# Patient Record
Sex: Female | Born: 1975 | Race: Asian | Hispanic: No | Marital: Married | Smoking: Never smoker
Health system: Southern US, Community
[De-identification: ages and names within clinical notes are randomized; demographics above are authoritative.]

## PROBLEM LIST (undated history)

## (undated) DIAGNOSIS — N762 Acute vulvitis: Secondary | ICD-10-CM

## (undated) DIAGNOSIS — N920 Excessive and frequent menstruation with regular cycle: Secondary | ICD-10-CM

## (undated) DIAGNOSIS — D649 Anemia, unspecified: Secondary | ICD-10-CM

## (undated) DIAGNOSIS — D249 Benign neoplasm of unspecified breast: Secondary | ICD-10-CM

## (undated) DIAGNOSIS — D563 Thalassemia minor: Secondary | ICD-10-CM

## (undated) DIAGNOSIS — N854 Malposition of uterus: Secondary | ICD-10-CM

## (undated) DIAGNOSIS — J069 Acute upper respiratory infection, unspecified: Secondary | ICD-10-CM

## (undated) HISTORY — DX: Anemia, unspecified: D64.9

## (undated) HISTORY — DX: Malposition of uterus: N85.4

## (undated) HISTORY — PX: BREAST EXCISIONAL BIOPSY: SUR124

## (undated) HISTORY — DX: Benign neoplasm of unspecified breast: D24.9

## (undated) HISTORY — DX: Acute vulvitis: N76.2

## (undated) HISTORY — DX: Thalassemia minor: D56.3

## (undated) HISTORY — DX: Excessive and frequent menstruation with regular cycle: N92.0

## (undated) HISTORY — DX: Acute upper respiratory infection, unspecified: J06.9

---
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
## 2004-01-30 ENCOUNTER — Other Ambulatory Visit: Admission: RE | Admit: 2004-01-30 | Discharge: 2004-01-30 | Payer: Self-pay | Admitting: Obstetrics and Gynecology

## 2004-04-15 ENCOUNTER — Ambulatory Visit (HOSPITAL_COMMUNITY): Admission: RE | Admit: 2004-04-15 | Discharge: 2004-04-15 | Payer: Self-pay | Admitting: Obstetrics and Gynecology

## 2004-08-09 ENCOUNTER — Inpatient Hospital Stay (HOSPITAL_COMMUNITY): Admission: RE | Admit: 2004-08-09 | Discharge: 2004-08-12 | Payer: Self-pay | Admitting: Obstetrics and Gynecology

## 2005-01-20 ENCOUNTER — Other Ambulatory Visit: Admission: RE | Admit: 2005-01-20 | Discharge: 2005-01-20 | Payer: Self-pay | Admitting: Obstetrics and Gynecology

## 2005-03-04 ENCOUNTER — Ambulatory Visit (HOSPITAL_COMMUNITY): Admission: RE | Admit: 2005-03-04 | Discharge: 2005-03-04 | Payer: Self-pay | Admitting: Obstetrics and Gynecology

## 2006-09-04 ENCOUNTER — Emergency Department (HOSPITAL_COMMUNITY): Admission: EM | Admit: 2006-09-04 | Discharge: 2006-09-04 | Payer: Self-pay | Admitting: Emergency Medicine

## 2006-12-16 ENCOUNTER — Encounter: Admission: RE | Admit: 2006-12-16 | Discharge: 2006-12-16 | Payer: Self-pay | Admitting: Obstetrics and Gynecology

## 2007-09-08 ENCOUNTER — Emergency Department (HOSPITAL_COMMUNITY): Admission: EM | Admit: 2007-09-08 | Discharge: 2007-09-08 | Payer: Self-pay | Admitting: Emergency Medicine

## 2010-07-14 ENCOUNTER — Encounter: Payer: Self-pay | Admitting: Obstetrics and Gynecology

## 2010-11-08 NOTE — Op Note (Signed)
Debra Austin, Debra Austin                 ACCOUNT NO.:  [id number]   MEDICAL RECORD NO.:  [id number]          PATIENT TYPE:  INP   LOCATION:  9137                          FACILITY:  [facility]   PHYSICIAN:  Hal Morales, M.D.DATE OF BIRTH:  1975-06-25   DATE OF PROCEDURE:  08/09/2004  DATE OF DISCHARGE:                                 OPERATIVE REPORT   PREOPERATIVE DIAGNOSES:  Intrauterine pregnancy at term, prior cesarean  section, desire for repeat cesarean section, upper respiratory infection.   POSTOPERATIVE DIAGNOSES:  Intrauterine pregnancy at term, prior cesarean  section, desire for repeat cesarean section, upper respiratory infection.   OPERATION:  Repeat low transverse cesarean section.   SURGEON:  Hal Morales, M.D.   FIRST ASSISTANT:  Wynelle Bourgeois, CNM   ANESTHESIA:  Spinal.   ESTIMATED BLOOD LOSS:  750 cc.   COMPLICATIONS:  None.   FINDINGS:  The patient was delivered of a female infant weighing 6 pounds 4  ounces with Apgar's of  9 & 9 at 1 and 5 minutes respectively. The uterus,  tubes and ovaries were normal for the gravid state.   PROCEDURE:  The patient is taken to the operating room after appropriate  identification and placed on the operating table. After placement of a  spinal anesthetic, she was placed in the supine position with a left lateral  tilt. The abdomen and perineum were prepped with multiple layers of Betadine  and the Foley catheter inserted into the bladder and connected to straight  drainage. The abdomen was draped as a sterile field. After assurance of  adequate anesthesia, subcutaneous injection of 0.25% Marcaine for total of  20 cc was undertaken in the suprapubic region of the old incision. That  incision was then sharply excised and the abdomen opened in layers. The  peritoneum was entered and the bladder blade placed. The uterus was incised  approximately 2 cm above the uterovesical fold and that incision taken  laterally on either  side bluntly. The infant was delivered from the occiput  transverse position and after having the nares and pharynx suctioned and  cord clamped and cut was handed off to awaiting pediatricians. The  appropriate cord blood was drawn and placenta noted to have separated from  the uterus and was removed from the operative field. The uterine incision  was closed with a running interlocking suture of #0Vicryl. An imbricating  suture of #0 Vicryl was placed. Hemostasis was achieved with several figure-  of-eight sutures in the uterus and copious irrigation carried out. The  abdominal peritoneum closed with a running suture of 2-0 Vicryl. The rectus  muscles were reapproximated in midline with figure-of-eight suture of 2-0  Vicryl. The rectus muscles were made hemostatic with Bovie cautery and  rectus fascia closed with running suture of #0 Vicryl then reinforced on  either side of midline with figure-of-eight sutures of #0 Vicryl. The  subcutaneous tissue was made hemostatic with Bovie cautery and irrigated. A  subcutaneous 7 mm Jackson-Pratt drain was placed through a stab wound in the  right lower quadrant. It was sewn in  with a suture of 2-0 silk. The incision  was then closed with a subcuticular suture of 3-0  Monocryl. A sterile dressing was applied and the patient taken from the  operating room to the recovery room in satisfactory condition having  tolerated the procedure well with sponge and instrument counts correct. The  infant went to the full-term nursery.      VPH/MEDQ  D:  08/09/2004  T:  08/10/2004  Job:  [id number]

## 2010-11-08 NOTE — Discharge Summary (Signed)
Debra Austin, Debra Austin                 ACCOUNT NO.:  [id number]   MEDICAL RECORD NO.:  [id number]          PATIENT TYPE:  INP   LOCATION:  9137                          [facility]   PHYSICIAN:  Hal Morales, M.D.DATE OF BIRTH:  Apr 23, 1976   DATE OF ADMISSION:  08/09/2004  DATE OF DISCHARGE:  08/12/2004                                 DISCHARGE SUMMARY   ADMISSION DIAGNOSES:  1.  Intrauterine pregnancy at 39 weeks.  2.  Previous cesarean section.  3.  Desires elective repeat cesarean section.   DISCHARGE DIAGNOSES:  1.  Intrauterine pregnancy at 39 weeks.  2.  Previous cesarean section.  3.  Desires elective repeat cesarean section.  4.  Status post cesarean delivery of a viable female infant weighing 6 pounds      4 ounces.  Apgars 9 and 9.   HOSPITAL PROCEDURES:  1.  Spinal anesthesia.  2.  Repeat low transverse cesarean section.   HOSPITAL COURSE:  The patient was admitted for an elective repeat cesarean  section, which was performed by Dr. Pennie Rushing without complications.  She was  taken to the mother baby unit, where she has done well for the last three  days.  On postoperative day #1 she was ambulating well, voiding, eating, and  drinking without difficulty.  She was getting good relief from [facility] with her cough and her upper respiratory infection improved over the  three days.  She was on amoxicillin previously for an upper respiratory  infection and continued on that.  Her hemoglobin was 9.2, which was stable.  On postoperative day #2 she continued to improve and on postoperative day #3  she was ready to go home.  Her cough was greatly improved.  Her vital signs  were stable and she was afebrile.  Her chest was clear.  Heart was a regular  rate and rhythm.  Abdomen was soft and appropriately tender.  The incision  was clean, dry, and intact.  The JP was draining a small amount.  Lochia was  small.  Extremities were within normal limits.  She was deemed to  have  receive the full benefit of her hospital stay and was discharged home after  removal of the JP drain.   DISCHARGE MEDICATIONS:  1.  Motrin 600 mg p.o. q.6h. p.r.n.  2.  Tylox 1-2 p.o. q.4h. p.r.n.  3.  Tessalon 100 mg p.o. q.4h. p.r.n.   DISCHARGE LABORATORIES:  White blood cell count 14.9, hemoglobin 9.2,  platelet count 282.  RPR nonreactive.   DISCHARGE INSTRUCTIONS:  Per [facility] handout.   DISCHARGE FOLLOWUP:  In six weeks or p.r.n.      MLW/MEDQ  D:  08/12/2004  T:  08/12/2004  Job:  [id number]

## 2010-11-08 NOTE — H&P (Signed)
Debra Austin, Debra Austin                 ACCOUNT NO.:  [id number]   MEDICAL RECORD NO.:  [id number]          PATIENT TYPE:  INP   LOCATION:  NA                            [facility]   PHYSICIAN:  Hal Morales, M.D.DATE OF BIRTH:  [birth year]   DATE OF ADMISSION:  DATE OF DISCHARGE:                                HISTORY & PHYSICAL   This is a 35 year old, gravida 2, para 1-0-0-1, at 38 weeks who presents for  an elective repeat cesarean section.  She had a C-section for her first baby  for footling breech presentation and has elected to have an elective repeat  cesarean section.  She developed an upper respiratory infection without  fever one week prior to scheduled cesarean.   Pregnancy has been followed by Dr. Pennie Rushing and remarkable for:  1.  Unsure LMP.  2.  Previous C-section.  3.  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  4.  Father of the baby also a thalassemia carrier.  5.  Removal of left breast fibroadenoma.  6.  Desires repeat C-section.   ALLERGIES:  None.   MEDICATIONS:  Prenatal vitamins, Hemocyte.   OBSTETRICAL HISTORY:  Remarkable for primary low-transverse cesarean  section, in 2001, with a female infant at 37 weeks' gestation, weighing 6  pounds 13 ounces, remarkable for a breech presentation with no  complications.   MEDICAL HISTORY:  1.  Childhood Varicella.  2.  Occasional yeast infections.  3.  Thalassemia carrier.  4.  Anemia   SURGICAL HISTORY:  1.  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  2.  C-section in 2001.   FAMILY HISTORY:  Remarkable for grandmother and grandfather with heart  disease.  Mother with hypertension.   GENETIC HISTORY:  Remarkable for the patient and her husband both with  thalassemia trait.  Genetic counseling and amniocentesis were declined.   HISTORY OF CURRENT PREGNANCY:  The patient entered care at 8 weeks'  gestation.  She elected care with Dr. Pennie Rushing.  Her ultrasound at 18 weeks  showed normal anatomy but  that anatomy scan was incomplete so she had  another one at 22 weeks which completed the anatomy scan.  She did have some  low iron at 22 weeks, and she was placed on iron supplementation.  Glucola  was within normal limits at 26 weeks.  She had some gastric reflux, treated  with Zantac.  She had poor weight gain through the pregnancy and had an  ultrasound at 34 weeks showing 50th percentile growth.  Group B strep and  cultures were negative at term and she presents for an elective repeat C-  section.   PRENATAL LABS:  Hemoglobin 10.7, platelets 261, blood type O positive,  antibody screen negative.  RPR nonreactive.  Rubella immune.  Hepatitis  negative.  HIV declined.  Glucola normal.  Group B strep negative.   OBJECTIVE DATA:  VITAL SIGNS:  Stable, afebrile.  HEENT:  Within normal limits.  Thyroid normal, not enlarged.  CHEST:  Clear to auscultation.  HEART:  Regular rate and rhythm.  ABDOMEN:  Gravid at 37-cm, vertex  to Leopold's.  Fetal heart rate 145.  EXTREMITIES:  Within normal limits.   ASSESSMENT:  1.  Intrauterine pregnancy at 39 weeks.  2.  Previous cesarean section.  3.  Desires elective repeat cesarean section.   PLAN:  1.  Admit to the operating suite.  2.  Routine OR orders.  3.  Further orders to follow after surgery.      MLW/MEDQ  D:  08/07/2004  T:  08/07/2004  Job:  [id number]

## 2011-03-17 LAB — POCT RAPID STREP A: Streptococcus, Group A Screen (Direct): NEGATIVE

## 2011-09-14 ENCOUNTER — Emergency Department (HOSPITAL_COMMUNITY): Admission: EM | Admit: 2011-09-14 | Discharge: 2011-09-14 | Discharge status: Against Medical Advice | Payer: Self-pay

## 2011-10-13 ENCOUNTER — Encounter: Payer: Self-pay | Admitting: Obstetrics and Gynecology

## 2011-12-01 ENCOUNTER — Telehealth: Payer: Self-pay | Admitting: Obstetrics and Gynecology

## 2011-12-01 NOTE — Telephone Encounter (Signed)
Debra Austin/cht received 

## 2011-12-02 NOTE — Telephone Encounter (Signed)
Tc to pt per telephone call. Pt c/o BTB this cycle. Pt started Loestrin 24 in January and has had good regulation of cycles. Pt admits to increase stress this month. Pt denies missing any pills. Informed pt to continue bcp's as directed and to call back if bldg increases(changing >1pad hourly), abd pain, or fever occurs. Fu appt sched 12/29/11 with vph. Pt agrees.

## 2011-12-29 ENCOUNTER — Encounter: Payer: Self-pay | Admitting: Obstetrics and Gynecology

## 2011-12-29 ENCOUNTER — Ambulatory Visit (INDEPENDENT_AMBULATORY_CARE_PROVIDER_SITE_OTHER): Payer: 59 | Admitting: Obstetrics and Gynecology

## 2011-12-29 VITALS — BP 112/64 | Ht 67.5 in | Wt 159.0 lb

## 2011-12-29 DIAGNOSIS — N939 Abnormal uterine and vaginal bleeding, unspecified: Secondary | ICD-10-CM

## 2011-12-29 DIAGNOSIS — D563 Thalassemia minor: Secondary | ICD-10-CM | POA: Insufficient documentation

## 2011-12-29 DIAGNOSIS — N926 Irregular menstruation, unspecified: Secondary | ICD-10-CM

## 2011-12-29 NOTE — Progress Notes (Signed)
Pt here to f/u on BCP.  States that in June she had some in between bleeding that has never happened before. State that she was in the process of moving which she believes to be the reason for that.No further irregular bleeding.  States that the BCP has helped with regulating cycle.   BP 112/64  Ht 5' 7.5" (1.715 m)  Wt 159 lb (72.122 kg)  BMI 24.54 kg/m2  LMP 12/15/2011  Pelvic exam: normal external genitalia, vulva, vagina, cervix, uterus and adnexa .  Impression: Improved menorrhagia on BCP's Thallasseia trait with anemia  Recommendation: Continue LoEstrin F/U 12/13 for aex

## 2012-03-01 ENCOUNTER — Other Ambulatory Visit: Payer: Self-pay | Admitting: Obstetrics and Gynecology

## 2012-03-01 ENCOUNTER — Telehealth: Payer: Self-pay | Admitting: Obstetrics and Gynecology

## 2012-03-01 ENCOUNTER — Telehealth: Payer: Self-pay

## 2012-03-01 MED ORDER — NORETHIN ACE-ETH ESTRAD-FE 1-20 MG-MCG PO TABS: 1.0000 | ORAL_TABLET | Freq: Every day | ORAL | Status: DC

## 2012-03-01 MED ORDER — NORETHINDRONE ACET-ETHINYL EST 1-20 MG-MCG PO TABS: 1.0000 | ORAL_TABLET | Freq: Every day | ORAL | Status: AC

## 2012-03-01 MED ORDER — NORETHIN ACE-ETH ESTRAD-FE 1-20 MG-MCG(24) PO CHEW: 1.0000 | CHEWABLE_TABLET | Freq: Every day | ORAL | Status: DC

## 2012-03-01 NOTE — Telephone Encounter (Signed)
Tc to pt per ep recs. Pt agrees. Pt will try new bc and cb if needs to switch back to prev rx based on cycles. Rx on file for Junel 1/20 e-pres to pharm on file.

## 2012-03-01 NOTE — Telephone Encounter (Signed)
Tc from pt. Pt needs rf for Loestrin 24;however med has been discontinued. Rx to be changed to Minastrin 24(sub for Loestrin 24;however pt's copay cost is too expensive for Minastrin. Pt opts to try another bcp similar to Loestrin 24(possibly Loestrin 1/20 per pharm). Pt due to start new pill pack on 03/07/12. Will consult with provider and cb with recs. Pt agrees.

## 2012-03-01 NOTE — Telephone Encounter (Signed)
Patient previously on Loestrin 24 and was switched to Minastin 24 due to discontinuation of the former is requesting pill change due to cost.  Patient may begin with next cycle Junel 1/20  # 1  1 po qd with 11 refills.  Ellowyn Rieves, PA-c

## 2012-03-01 NOTE — Addendum Note (Signed)
Addended by: Lerry Liner D on: 03/01/2012 01:46 PM   Modules accepted: Orders

## 2012-03-29 ENCOUNTER — Telehealth: Payer: Self-pay

## 2012-03-29 NOTE — Telephone Encounter (Signed)
Tc from pt. Pt would like Loestrin 1/20 with added iron. Rx called into pharm on file. Spoke with Tera(pharm). Pt aware.

## 2012-03-30 ENCOUNTER — Telehealth: Payer: Self-pay

## 2012-03-30 MED ORDER — NORETHIN ACE-ETH ESTRAD-FE 1-20 MG-MCG PO TABS: 1.0000 | ORAL_TABLET | Freq: Every day | ORAL | Status: DC

## 2012-03-30 NOTE — Telephone Encounter (Signed)
Send new rx to pt pharmacy. Pt wanted Loestrin 1/20 fe instead of Loestrin 1/20

## 2012-03-31 ENCOUNTER — Other Ambulatory Visit: Payer: Self-pay

## 2012-03-31 MED ORDER — NORETHIN ACE-ETH ESTRAD-FE 1-20 MG-MCG PO TABS: 1.0000 | ORAL_TABLET | Freq: Every day | ORAL | Status: DC

## 2012-03-31 NOTE — Telephone Encounter (Signed)
loestrin refilled per protocol  ld

## 2012-05-31 ENCOUNTER — Encounter: Payer: Self-pay | Admitting: Obstetrics and Gynecology

## 2012-05-31 ENCOUNTER — Ambulatory Visit (INDEPENDENT_AMBULATORY_CARE_PROVIDER_SITE_OTHER): Payer: 59 | Admitting: Obstetrics and Gynecology

## 2012-05-31 VITALS — BP 104/62 | HR 70 | Resp 16 | Ht 67.0 in | Wt 152.0 lb

## 2012-05-31 DIAGNOSIS — D563 Thalassemia minor: Secondary | ICD-10-CM

## 2012-05-31 DIAGNOSIS — N939 Abnormal uterine and vaginal bleeding, unspecified: Secondary | ICD-10-CM

## 2012-05-31 DIAGNOSIS — Z124 Encounter for screening for malignant neoplasm of cervix: Secondary | ICD-10-CM

## 2012-05-31 MED ORDER — NORETHIN ACE-ETH ESTRAD-FE 1-20 MG-MCG PO TABS: 1.0000 | ORAL_TABLET | Freq: Every day | ORAL | Status: AC

## 2012-05-31 NOTE — Progress Notes (Signed)
AEX  Last Pap: 06/02/2011 WNL: Yes Regular Periods:yes Contraception: Birth control pill  Monthly Breast exam:yes Tetanus<67yrs:yes Nl.Bladder Function:yes Daily BMs:yes Healthy Diet:yes Calcium:yes Mammogram:yes Date of Mammogram: 5 yrs ago Exercise:no Have often Exercise: NA Seatbelt: yes Abuse at home: no Stressful work:no Sigmoid-colonoscopy: None Bone Density: No PCP: Georgann Housekeeper Change in PMH: None Change in [facility]: None  Subjective:    Debra Austin is a 36 y.o. female, G2P2002, who presents for an annual exam without complaint    History   Social History  . Marital Status: Married    Spouse Name: N/A    Number of Children: N/A  . Years of Education: N/A   Social History Main Topics  . Smoking status: Never Smoker   . Smokeless tobacco: Never Used  . Alcohol Use: No  . Drug Use: No  . Sexually Active: Yes    Birth Control/ Protection: Pill   Other Topics Concern  . None   Social History Narrative  . None    Menstrual cycle:   LMP: Patient's last menstrual period was 05/22/2012.           Cycle: Regular without IM bleeding on bcps The following portions of the patient's history were reviewed and updated as appropriate: allergies, current medications, past family history, past medical history, past social history, past surgical history and problem list.  Review of Systems Pertinent items are noted in HPI. Breast:Negative for breast lump,nipple discharge or nipple retraction Gastrointestinal: Negative for abdominal pain, change in bowel habits or rectal bleeding Urinary:negative   Objective:    BP 104/62  Pulse 70  Resp 16  Ht 5\' 7"  (1.702 m)  Wt 152 lb (68.947 kg)  BMI 23.81 kg/m2  LMP 05/22/2012    Weight:  Wt Readings from Last 1 Encounters:  05/31/12 152 lb (68.947 kg)          BMI: Body mass index is 23.81 kg/(m^2).  General Appearance: Alert, appropriate appearance for age. No acute distress HEENT: Grossly normal Neck / Thyroid:  Supple, no masses, nodes or enlargement Lungs: clear to auscultation bilaterally Back: No CVA tenderness Breast Exam: right breast mass at 3 o'clock  3mm mobile and nontender.  Left breast with mass at 4 o'clock mobile and nontender 5mm Cardiovascular: Regular rate and rhythm. S1, S2, no murmur Gastrointestinal: Soft, non-tender, no masses or organomegaly Pelvic Exam: Vulva and vagina appear normal. Bimanual exam reveals normal uterus and adnexa. Rectovaginal: normal rectal, no masses Lymphatic Exam: Non-palpable nodes in neck, clavicular, axillary, or inguinal regions Skin: no rash or abnormalities Neurologic: Normal gait and speech, no tremor  Psychiatric: Alert and oriented, appropriate affect.   Wet Prep:not applicable Urinalysis:not applicable UPT: Not done   Assessment:    History of fibroadenoma removal in 1995, now with bilateral masses most consistent on exam with fibroadenomas  Abnormal uterine bleeding, well managed with BCPs  Plan:    pap smear with HR HPV return annually or prn BILATERAL breast u/s STD screening: declined Contraception:oral contraceptives (estrogen/progesterone)   Dierdre Forth MD

## 2012-06-01 ENCOUNTER — Other Ambulatory Visit: Payer: Self-pay

## 2012-06-01 DIAGNOSIS — D249 Benign neoplasm of unspecified breast: Secondary | ICD-10-CM

## 2012-06-01 DIAGNOSIS — N6029 Fibroadenosis of unspecified breast: Secondary | ICD-10-CM

## 2012-06-01 LAB — PAP IG AND HPV HIGH-RISK: HPV DNA High Risk: NOT DETECTED

## 2012-06-09 ENCOUNTER — Encounter: Payer: Self-pay | Admitting: Obstetrics and Gynecology

## 2012-06-09 ENCOUNTER — Ambulatory Visit
Admission: RE | Admit: 2012-06-09 | Discharge: 2012-06-09 | Disposition: A | Discharge status: Home / Self Care | Payer: 59 | Source: Ambulatory Visit | Attending: Obstetrics and Gynecology | Admitting: Obstetrics and Gynecology

## 2012-06-09 ENCOUNTER — Other Ambulatory Visit: Payer: Self-pay | Admitting: Obstetrics and Gynecology

## 2012-06-09 DIAGNOSIS — N6029 Fibroadenosis of unspecified breast: Secondary | ICD-10-CM

## 2012-06-09 DIAGNOSIS — R922 Inconclusive mammogram: Secondary | ICD-10-CM | POA: Insufficient documentation

## 2012-06-09 DIAGNOSIS — D249 Benign neoplasm of unspecified breast: Secondary | ICD-10-CM

## 2012-06-09 DIAGNOSIS — N63 Unspecified lump in unspecified breast: Secondary | ICD-10-CM

## 2012-06-10 ENCOUNTER — Encounter: Payer: Self-pay | Admitting: Obstetrics and Gynecology

## 2014-04-24 ENCOUNTER — Encounter: Payer: Self-pay | Admitting: Obstetrics and Gynecology

## 2014-05-01 DIAGNOSIS — N926 Irregular menstruation, unspecified: Secondary | ICD-10-CM | POA: Insufficient documentation

## 2014-05-05 ENCOUNTER — Other Ambulatory Visit: Payer: Self-pay | Admitting: Obstetrics and Gynecology

## 2014-05-05 DIAGNOSIS — N649 Disorder of breast, unspecified: Secondary | ICD-10-CM

## 2014-05-22 ENCOUNTER — Ambulatory Visit
Admission: RE | Admit: 2014-05-22 | Discharge: 2014-05-22 | Disposition: A | Discharge status: Home / Self Care | Payer: 59 | Source: Ambulatory Visit | Attending: Obstetrics and Gynecology | Admitting: Obstetrics and Gynecology

## 2014-05-22 ENCOUNTER — Other Ambulatory Visit: Payer: Self-pay | Admitting: Obstetrics and Gynecology

## 2014-05-22 ENCOUNTER — Encounter (INDEPENDENT_AMBULATORY_CARE_PROVIDER_SITE_OTHER): Payer: Self-pay

## 2014-05-22 DIAGNOSIS — N649 Disorder of breast, unspecified: Secondary | ICD-10-CM

## 2015-05-15 ENCOUNTER — Other Ambulatory Visit: Payer: Self-pay | Admitting: Obstetrics and Gynecology

## 2015-05-15 DIAGNOSIS — N63 Unspecified lump in unspecified breast: Secondary | ICD-10-CM

## 2015-07-03 MED FILL — DASETTA 1-35-28 TABLET: 1-35 | 84 days supply | Qty: 84 | Fill #1

## 2015-07-11 DIAGNOSIS — D638 Anemia in other chronic diseases classified elsewhere: Secondary | ICD-10-CM | POA: Diagnosis not present

## 2015-07-11 DIAGNOSIS — Z0000 Encounter for general adult medical examination without abnormal findings: Secondary | ICD-10-CM | POA: Diagnosis not present

## 2015-07-11 DIAGNOSIS — R7309 Other abnormal glucose: Secondary | ICD-10-CM | POA: Diagnosis not present

## 2015-07-11 DIAGNOSIS — E559 Vitamin D deficiency, unspecified: Secondary | ICD-10-CM | POA: Diagnosis not present

## 2015-07-11 DIAGNOSIS — Z1389 Encounter for screening for other disorder: Secondary | ICD-10-CM | POA: Diagnosis not present

## 2015-07-11 DIAGNOSIS — K219 Gastro-esophageal reflux disease without esophagitis: Secondary | ICD-10-CM | POA: Diagnosis not present

## 2015-07-11 DIAGNOSIS — D563 Thalassemia minor: Secondary | ICD-10-CM | POA: Diagnosis not present

## 2015-07-11 MED FILL — PANTOPRAZOLE SOD DR 40 MG T: 40 | 30 days supply | Qty: 30 | Fill #0

## 2015-08-27 ENCOUNTER — Other Ambulatory Visit: Payer: Self-pay

## 2015-08-27 DIAGNOSIS — Z1231 Encounter for screening mammogram for malignant neoplasm of breast: Secondary | ICD-10-CM

## 2015-09-17 ENCOUNTER — Ambulatory Visit
Admission: RE | Admit: 2015-09-17 | Discharge: 2015-09-17 | Disposition: A | Discharge status: Home / Self Care | Payer: 59 | Source: Ambulatory Visit

## 2015-09-17 DIAGNOSIS — Z1231 Encounter for screening mammogram for malignant neoplasm of breast: Secondary | ICD-10-CM | POA: Diagnosis not present

## 2015-10-08 MED FILL — DASETTA 1-35-28 TABLET: 1-35 | 84 days supply | Qty: 84 | Fill #0

## 2015-10-12 DIAGNOSIS — H5213 Myopia, bilateral: Secondary | ICD-10-CM | POA: Diagnosis not present

## 2015-11-26 MED FILL — KETOCONAZOLE 2% CREAM: 2 | 10 days supply | Qty: 30 | Fill #0

## 2015-12-05 MED FILL — AMOX-CLAV 500-125 MG TABLET: 500-125 | 10 days supply | Qty: 20 | Fill #0

## 2015-12-24 MED FILL — DASETTA 1-35-28 TABLET: 1-35 | 84 days supply | Qty: 84 | Fill #1

## 2016-03-25 MED FILL — DASETTA 1-35-28 TABLET: 1-35 | 84 days supply | Qty: 84 | Fill #0

## 2016-05-19 DIAGNOSIS — D249 Benign neoplasm of unspecified breast: Secondary | ICD-10-CM | POA: Diagnosis not present

## 2016-05-19 DIAGNOSIS — Z6826 Body mass index (BMI) 26.0-26.9, adult: Secondary | ICD-10-CM | POA: Diagnosis not present

## 2016-05-19 DIAGNOSIS — Z3040 Encounter for surveillance of contraceptives, unspecified: Secondary | ICD-10-CM | POA: Diagnosis not present

## 2016-05-19 DIAGNOSIS — Z01419 Encounter for gynecological examination (general) (routine) without abnormal findings: Secondary | ICD-10-CM | POA: Diagnosis not present

## 2016-06-11 MED FILL — DASETTA 1-35-28 TABLET: 1-35 | 84 days supply | Qty: 84 | Fill #0

## 2016-06-26 MED FILL — OSELTAMIVIR PHOS 75 MG CAP: 75 | 5 days supply | Qty: 10 | Fill #0

## 2016-07-23 DIAGNOSIS — Z1389 Encounter for screening for other disorder: Secondary | ICD-10-CM | POA: Diagnosis not present

## 2016-07-23 DIAGNOSIS — D563 Thalassemia minor: Secondary | ICD-10-CM | POA: Diagnosis not present

## 2016-07-23 DIAGNOSIS — E559 Vitamin D deficiency, unspecified: Secondary | ICD-10-CM | POA: Diagnosis not present

## 2016-07-23 DIAGNOSIS — R7309 Other abnormal glucose: Secondary | ICD-10-CM | POA: Diagnosis not present

## 2016-07-23 DIAGNOSIS — K219 Gastro-esophageal reflux disease without esophagitis: Secondary | ICD-10-CM | POA: Diagnosis not present

## 2016-07-23 DIAGNOSIS — D72829 Elevated white blood cell count, unspecified: Secondary | ICD-10-CM | POA: Diagnosis not present

## 2016-07-23 DIAGNOSIS — Z23 Encounter for immunization: Secondary | ICD-10-CM | POA: Diagnosis not present

## 2016-07-23 DIAGNOSIS — Z0000 Encounter for general adult medical examination without abnormal findings: Secondary | ICD-10-CM | POA: Diagnosis not present

## 2016-09-04 MED FILL — DASETTA 1-35-28 TABLET: 1-35 | 84 days supply | Qty: 84 | Fill #1

## 2016-10-22 ENCOUNTER — Other Ambulatory Visit: Payer: Self-pay | Admitting: Obstetrics and Gynecology

## 2016-10-22 DIAGNOSIS — Z1231 Encounter for screening mammogram for malignant neoplasm of breast: Secondary | ICD-10-CM

## 2016-11-18 ENCOUNTER — Ambulatory Visit
Admission: RE | Admit: 2016-11-18 | Discharge: 2016-11-18 | Disposition: A | Discharge status: Home / Self Care | Payer: 59 | Source: Ambulatory Visit | Attending: Obstetrics and Gynecology | Admitting: Obstetrics and Gynecology

## 2016-11-18 DIAGNOSIS — Z1231 Encounter for screening mammogram for malignant neoplasm of breast: Secondary | ICD-10-CM

## 2016-11-20 MED FILL — AZITHROMYCIN 250 MG TAB: 250 | 5 days supply | Qty: 6 | Fill #0

## 2016-12-01 MED FILL — DASETTA 1-35-28 TABLET: 1-35 | 84 days supply | Qty: 84 | Fill #0

## 2017-02-24 MED FILL — DASETTA 1-35-28 TABLET: 1-35 | 84 days supply | Qty: 84 | Fill #1

## 2017-04-01 DIAGNOSIS — Z23 Encounter for immunization: Secondary | ICD-10-CM | POA: Diagnosis not present

## 2017-04-01 DIAGNOSIS — L650 Telogen effluvium: Secondary | ICD-10-CM | POA: Diagnosis not present

## 2017-04-01 DIAGNOSIS — L648 Other androgenic alopecia: Secondary | ICD-10-CM | POA: Diagnosis not present

## 2017-04-01 DIAGNOSIS — L601 Onycholysis: Secondary | ICD-10-CM | POA: Diagnosis not present

## 2017-04-01 DIAGNOSIS — L608 Other nail disorders: Secondary | ICD-10-CM | POA: Diagnosis not present

## 2017-05-20 DIAGNOSIS — Z6826 Body mass index (BMI) 26.0-26.9, adult: Secondary | ICD-10-CM | POA: Diagnosis not present

## 2017-05-20 DIAGNOSIS — N762 Acute vulvitis: Secondary | ICD-10-CM | POA: Insufficient documentation

## 2017-05-20 DIAGNOSIS — N926 Irregular menstruation, unspecified: Secondary | ICD-10-CM | POA: Diagnosis not present

## 2017-05-20 DIAGNOSIS — Z3040 Encounter for surveillance of contraceptives, unspecified: Secondary | ICD-10-CM | POA: Diagnosis not present

## 2017-05-20 DIAGNOSIS — Z01419 Encounter for gynecological examination (general) (routine) without abnormal findings: Secondary | ICD-10-CM | POA: Diagnosis not present

## 2017-05-20 DIAGNOSIS — Z124 Encounter for screening for malignant neoplasm of cervix: Secondary | ICD-10-CM | POA: Diagnosis not present

## 2017-05-20 MED FILL — DASETTA 1-35-28 TABLET: 1-35 | 84 days supply | Qty: 84 | Fill #0

## 2017-06-30 DIAGNOSIS — Z23 Encounter for immunization: Secondary | ICD-10-CM | POA: Diagnosis not present

## 2017-07-28 DIAGNOSIS — Z1389 Encounter for screening for other disorder: Secondary | ICD-10-CM | POA: Diagnosis not present

## 2017-07-28 DIAGNOSIS — D638 Anemia in other chronic diseases classified elsewhere: Secondary | ICD-10-CM | POA: Diagnosis not present

## 2017-07-28 DIAGNOSIS — E559 Vitamin D deficiency, unspecified: Secondary | ICD-10-CM | POA: Diagnosis not present

## 2017-07-28 DIAGNOSIS — R7303 Prediabetes: Secondary | ICD-10-CM | POA: Diagnosis not present

## 2017-07-28 DIAGNOSIS — D563 Thalassemia minor: Secondary | ICD-10-CM | POA: Diagnosis not present

## 2017-07-28 DIAGNOSIS — Z0000 Encounter for general adult medical examination without abnormal findings: Secondary | ICD-10-CM | POA: Diagnosis not present

## 2017-07-28 DIAGNOSIS — D72829 Elevated white blood cell count, unspecified: Secondary | ICD-10-CM | POA: Diagnosis not present

## 2017-07-29 MED FILL — NYSTATIN 100,000 UNIT/GM CR: 100000 | 15 days supply | Qty: 30 | Fill #0

## 2017-08-10 MED FILL — DASETTA 1-35-28 TABLET: 1-35 | 84 days supply | Qty: 84 | Fill #1

## 2017-09-04 DIAGNOSIS — Z411 Encounter for cosmetic surgery: Secondary | ICD-10-CM | POA: Diagnosis not present

## 2017-09-04 DIAGNOSIS — D224 Melanocytic nevi of scalp and neck: Secondary | ICD-10-CM | POA: Diagnosis not present

## 2017-09-04 DIAGNOSIS — L601 Onycholysis: Secondary | ICD-10-CM | POA: Diagnosis not present

## 2017-09-04 DIAGNOSIS — L219 Seborrheic dermatitis, unspecified: Secondary | ICD-10-CM | POA: Diagnosis not present

## 2017-09-08 MED FILL — KETOCONAZOLE 2% SHAMPOO: 2 | 30 days supply | Qty: 120 | Fill #0

## 2017-09-28 MED FILL — MONTELUKAST SOD 10 MG TAB: 10 | 30 days supply | Qty: 30 | Fill #0

## 2017-10-21 DIAGNOSIS — H5213 Myopia, bilateral: Secondary | ICD-10-CM | POA: Diagnosis not present

## 2017-10-30 MED FILL — MONTELUKAST SOD 10 MG TAB: 10 | 30 days supply | Qty: 30 | Fill #1

## 2017-11-02 MED FILL — DASETTA 1-35-28 TABLET: 1-35 | 84 days supply | Qty: 84 | Fill #0

## 2018-01-18 MED FILL — KETOCONAZOLE 2% SHAMPOO: 2 | 30 days supply | Qty: 120 | Fill #1

## 2018-01-18 MED FILL — DASETTA 1-35-28 TABLET: 1-35 | 84 days supply | Qty: 84 | Fill #1

## 2018-01-27 ENCOUNTER — Ambulatory Visit (INDEPENDENT_AMBULATORY_CARE_PROVIDER_SITE_OTHER): Payer: 59

## 2018-01-27 ENCOUNTER — Encounter: Payer: Self-pay | Admitting: Podiatry

## 2018-01-27 ENCOUNTER — Other Ambulatory Visit: Payer: Self-pay | Admitting: Podiatry

## 2018-01-27 ENCOUNTER — Ambulatory Visit: Payer: 59 | Admitting: Podiatry

## 2018-01-27 VITALS — BP 138/86 | HR 87 | Resp 16

## 2018-01-27 DIAGNOSIS — D649 Anemia, unspecified: Secondary | ICD-10-CM | POA: Insufficient documentation

## 2018-01-27 DIAGNOSIS — M21619 Bunion of unspecified foot: Secondary | ICD-10-CM | POA: Diagnosis not present

## 2018-01-27 DIAGNOSIS — M2012 Hallux valgus (acquired), left foot: Secondary | ICD-10-CM

## 2018-01-27 DIAGNOSIS — D569 Thalassemia, unspecified: Secondary | ICD-10-CM | POA: Insufficient documentation

## 2018-01-27 DIAGNOSIS — M79675 Pain in left toe(s): Secondary | ICD-10-CM

## 2018-01-27 DIAGNOSIS — L600 Ingrowing nail: Secondary | ICD-10-CM | POA: Diagnosis not present

## 2018-01-27 NOTE — Progress Notes (Signed)
   Subjective:    Patient ID: Debra Austin, female    DOB: 01-Apr-1976, 42 y.o.   MRN: [id number]  HPI    Review of Systems  All other systems reviewed and are negative.      Objective:   Physical Exam        Assessment & Plan:

## 2018-01-28 NOTE — Progress Notes (Signed)
Subjective:   Patient ID: Debra Austin, female   DOB: 42 y.o.   MRN: [id number]   HPI Patient presents stating she is had some tingling and burning in the side of her big toe left and thinks she may have had an ingrown toenail left big toe which seems to be somewhat improved but at times can be still little bit bothersome.  She was on her feet a lot as her husband received a transplant and she did have to do more traveling.  Patient does not smoke likes to be active   Review of Systems  All other systems reviewed and are negative.       Objective:  Physical Exam  Constitutional: She appears well-developed and well-nourished.  Cardiovascular: Intact distal pulses.  Pulmonary/Chest: Effort normal.  Musculoskeletal: Normal range of motion.  Neurological: She is alert.  Skin: Skin is warm.  Nursing note and vitals reviewed.   Neurovascular status intact muscle strength within normal limits range of motion adequate with patient found to have mild prominence around the first metatarsal head left with irritation around the metatarsal head itself and mild discomfort upon palpation of the left hallux medial border with distal irritation of the nailbed but no drainage or redness     Assessment:  Possibility for low-grade nerve irritation across the first metatarsal head left with mild structural deformity and possibility for ingrown toenail deformity left     Plan:  H&P x-ray reviewed and at this point I recommended wider shoes and soaks and if symptoms persist or ingrown toenail gets worse we will correct.  May ultimately require a small structural bunion procedure but at this point I think it is premature  X-ray indicated mild structural deformity but no indications of acute injury

## 2018-02-01 MED FILL — MONTELUKAST SOD 10 MG TAB: 10 | 30 days supply | Qty: 30 | Fill #2

## 2018-03-04 MED FILL — MONTELUKAST SOD 10 MG TAB: 10 | 30 days supply | Qty: 30 | Fill #3

## 2018-03-26 ENCOUNTER — Other Ambulatory Visit: Payer: Self-pay | Admitting: Obstetrics and Gynecology

## 2018-03-26 ENCOUNTER — Other Ambulatory Visit (HOSPITAL_COMMUNITY): Payer: Self-pay | Admitting: Diagnostic Radiology

## 2018-03-26 DIAGNOSIS — Z1231 Encounter for screening mammogram for malignant neoplasm of breast: Secondary | ICD-10-CM

## 2018-04-02 MED FILL — DASETTA 1-35-28 TABLET: 1-35 | 84 days supply | Qty: 84 | Fill #0

## 2018-04-26 ENCOUNTER — Ambulatory Visit
Admission: RE | Admit: 2018-04-26 | Discharge: 2018-04-26 | Disposition: A | Discharge status: Home / Self Care | Payer: 59 | Source: Ambulatory Visit | Attending: Obstetrics and Gynecology | Admitting: Obstetrics and Gynecology

## 2018-04-26 DIAGNOSIS — Z1231 Encounter for screening mammogram for malignant neoplasm of breast: Secondary | ICD-10-CM

## 2018-05-26 DIAGNOSIS — D249 Benign neoplasm of unspecified breast: Secondary | ICD-10-CM | POA: Diagnosis not present

## 2018-05-26 DIAGNOSIS — Z6824 Body mass index (BMI) 24.0-24.9, adult: Secondary | ICD-10-CM | POA: Diagnosis not present

## 2018-05-26 DIAGNOSIS — Z01419 Encounter for gynecological examination (general) (routine) without abnormal findings: Secondary | ICD-10-CM | POA: Diagnosis not present

## 2018-05-26 DIAGNOSIS — Z3040 Encounter for surveillance of contraceptives, unspecified: Secondary | ICD-10-CM | POA: Diagnosis not present

## 2018-07-07 MED FILL — DASETTA 1-35-28 TABLET: 1-35 | 84 days supply | Qty: 84 | Fill #0

## 2018-07-07 MED FILL — KETOCONAZOLE 2% SHAMPOO: 2 | 30 days supply | Qty: 120 | Fill #2

## 2018-07-30 DIAGNOSIS — E559 Vitamin D deficiency, unspecified: Secondary | ICD-10-CM | POA: Diagnosis not present

## 2018-07-30 DIAGNOSIS — J309 Allergic rhinitis, unspecified: Secondary | ICD-10-CM | POA: Diagnosis not present

## 2018-07-30 DIAGNOSIS — R7303 Prediabetes: Secondary | ICD-10-CM | POA: Diagnosis not present

## 2018-07-30 DIAGNOSIS — Z23 Encounter for immunization: Secondary | ICD-10-CM | POA: Diagnosis not present

## 2018-07-30 DIAGNOSIS — Z0000 Encounter for general adult medical examination without abnormal findings: Secondary | ICD-10-CM | POA: Diagnosis not present

## 2018-07-30 DIAGNOSIS — Z13220 Encounter for screening for lipoid disorders: Secondary | ICD-10-CM | POA: Diagnosis not present

## 2018-07-30 DIAGNOSIS — Z1389 Encounter for screening for other disorder: Secondary | ICD-10-CM | POA: Diagnosis not present

## 2018-07-30 DIAGNOSIS — D563 Thalassemia minor: Secondary | ICD-10-CM | POA: Diagnosis not present

## 2018-09-10 MED FILL — DASETTA 1-35-28 TABLET: 1-35 | 84 days supply | Qty: 84 | Fill #1

## 2018-09-23 MED FILL — MONTELUKAST SOD 10 MG TAB: 10 | 30 days supply | Qty: 30 | Fill #4

## 2018-10-20 MED FILL — PANTOPRAZOLE SOD DR 40 MG T: 40 | 30 days supply | Qty: 30 | Fill #0

## 2018-12-08 MED FILL — DASETTA 1-35-28 TABLET: 1-35 | 84 days supply | Qty: 84 | Fill #2

## 2019-01-31 DIAGNOSIS — H5213 Myopia, bilateral: Secondary | ICD-10-CM | POA: Diagnosis not present

## 2019-03-09 MED FILL — DASETTA 1-35-28 TABLET: 1-35 | 84 days supply | Qty: 84 | Fill #3

## 2019-03-17 DIAGNOSIS — Z23 Encounter for immunization: Secondary | ICD-10-CM | POA: Diagnosis not present

## 2019-04-13 ENCOUNTER — Other Ambulatory Visit: Payer: Self-pay | Admitting: Obstetrics and Gynecology

## 2019-04-13 DIAGNOSIS — Z1231 Encounter for screening mammogram for malignant neoplasm of breast: Secondary | ICD-10-CM

## 2019-05-30 MED FILL — DASETTA 1-35-28 TABLET: 1-35 | 84 days supply | Qty: 84 | Fill #0

## 2019-06-01 ENCOUNTER — Other Ambulatory Visit: Payer: Self-pay

## 2019-06-01 ENCOUNTER — Ambulatory Visit
Admission: RE | Admit: 2019-06-01 | Discharge: 2019-06-01 | Disposition: A | Discharge status: Home / Self Care | Payer: 59 | Source: Ambulatory Visit | Attending: Obstetrics and Gynecology | Admitting: Obstetrics and Gynecology

## 2019-06-01 DIAGNOSIS — Z1231 Encounter for screening mammogram for malignant neoplasm of breast: Secondary | ICD-10-CM | POA: Diagnosis not present

## 2019-07-04 DIAGNOSIS — Z3040 Encounter for surveillance of contraceptives, unspecified: Secondary | ICD-10-CM | POA: Diagnosis not present

## 2019-07-04 DIAGNOSIS — Z01411 Encounter for gynecological examination (general) (routine) with abnormal findings: Secondary | ICD-10-CM | POA: Diagnosis not present

## 2019-07-04 DIAGNOSIS — R030 Elevated blood-pressure reading, without diagnosis of hypertension: Secondary | ICD-10-CM | POA: Diagnosis not present

## 2019-07-04 DIAGNOSIS — Z6825 Body mass index (BMI) 25.0-25.9, adult: Secondary | ICD-10-CM | POA: Diagnosis not present

## 2019-07-04 MED FILL — BLISOVI 24 FE 1-20 MG-MCG(2: 1-20 | 84 days supply | Qty: 84 | Fill #0

## 2019-09-14 MED FILL — BLISOVI 24 FE 1-20 MG-MCG(2: 1-20 | 84 days supply | Qty: 84 | Fill #0

## 2019-10-07 ENCOUNTER — Other Ambulatory Visit (HOSPITAL_COMMUNITY): Payer: Self-pay | Admitting: Internal Medicine

## 2019-10-07 DIAGNOSIS — E559 Vitamin D deficiency, unspecified: Secondary | ICD-10-CM | POA: Diagnosis not present

## 2019-10-07 DIAGNOSIS — D638 Anemia in other chronic diseases classified elsewhere: Secondary | ICD-10-CM | POA: Diagnosis not present

## 2019-10-07 DIAGNOSIS — Z0000 Encounter for general adult medical examination without abnormal findings: Secondary | ICD-10-CM | POA: Diagnosis not present

## 2019-10-07 DIAGNOSIS — R7303 Prediabetes: Secondary | ICD-10-CM | POA: Diagnosis not present

## 2019-10-07 DIAGNOSIS — Z1389 Encounter for screening for other disorder: Secondary | ICD-10-CM | POA: Diagnosis not present

## 2019-10-07 DIAGNOSIS — K219 Gastro-esophageal reflux disease without esophagitis: Secondary | ICD-10-CM | POA: Diagnosis not present

## 2019-10-07 DIAGNOSIS — Z13220 Encounter for screening for lipoid disorders: Secondary | ICD-10-CM | POA: Diagnosis not present

## 2019-10-07 DIAGNOSIS — D563 Thalassemia minor: Secondary | ICD-10-CM | POA: Diagnosis not present

## 2019-10-07 DIAGNOSIS — J309 Allergic rhinitis, unspecified: Secondary | ICD-10-CM | POA: Diagnosis not present

## 2019-10-10 MED FILL — PANTOPRAZOLE SOD DR 40 MG T: 40 | 90 days supply | Qty: 90 | Fill #0

## 2019-10-10 MED FILL — VIT D2 1.25 MG (50,000 UNIT: 1.25 MG | 84 days supply | Qty: 12 | Fill #0

## 2019-10-10 MED FILL — MONTELUKAST SOD 10 MG TAB: 10 | 90 days supply | Qty: 90 | Fill #0

## 2019-12-20 MED FILL — TARINA 24 FE 1-20 MG-MCG(24: 1-20 | 28 days supply | Qty: 28 | Fill #1

## 2020-01-19 MED FILL — TARINA 24 FE 1-20 MG-MCG(24: 1-20 | 28 days supply | Qty: 28 | Fill #2

## 2020-02-06 DIAGNOSIS — H5213 Myopia, bilateral: Secondary | ICD-10-CM | POA: Diagnosis not present

## 2020-02-13 MED FILL — BLISOVI 24 FE 1-20 MG-MCG(2: 1-20 | 56 days supply | Qty: 56 | Fill #3

## 2020-04-02 MED FILL — BLISOVI 24 FE 1-20 MG-MCG(2: 1-20 | 56 days supply | Qty: 56 | Fill #4

## 2020-05-02 ENCOUNTER — Other Ambulatory Visit (HOSPITAL_COMMUNITY): Payer: Self-pay | Admitting: Obstetrics & Gynecology

## 2020-05-02 ENCOUNTER — Other Ambulatory Visit: Payer: Self-pay | Admitting: Obstetrics & Gynecology

## 2020-05-02 DIAGNOSIS — Z1231 Encounter for screening mammogram for malignant neoplasm of breast: Secondary | ICD-10-CM

## 2020-05-04 DIAGNOSIS — Z23 Encounter for immunization: Secondary | ICD-10-CM | POA: Diagnosis not present

## 2020-05-18 MED FILL — BLISOVI 24 FE 1-20 MG-MCG(2: 1-20 | 84 days supply | Qty: 84 | Fill #0

## 2020-06-05 ENCOUNTER — Other Ambulatory Visit: Payer: Self-pay

## 2020-06-05 ENCOUNTER — Ambulatory Visit
Admission: RE | Admit: 2020-06-05 | Discharge: 2020-06-05 | Disposition: A | Discharge status: Home / Self Care | Payer: 59 | Source: Ambulatory Visit | Attending: Obstetrics & Gynecology | Admitting: Obstetrics & Gynecology

## 2020-06-05 DIAGNOSIS — Z1231 Encounter for screening mammogram for malignant neoplasm of breast: Secondary | ICD-10-CM

## 2020-06-18 ENCOUNTER — Ambulatory Visit: Payer: 59

## 2020-06-20 MED FILL — MONTELUKAST SOD 10 MG TAB: 10 | 90 days supply | Qty: 90 | Fill #2

## 2020-07-17 ENCOUNTER — Other Ambulatory Visit (HOSPITAL_COMMUNITY): Payer: Self-pay | Admitting: Dermatology

## 2020-07-20 MED FILL — KETOCONAZOLE 2% SHAMPOO: 2 | 30 days supply | Qty: 120 | Fill #0

## 2020-07-31 ENCOUNTER — Other Ambulatory Visit (HOSPITAL_COMMUNITY): Payer: Self-pay | Admitting: Obstetrics and Gynecology

## 2020-07-31 DIAGNOSIS — D241 Benign neoplasm of right breast: Secondary | ICD-10-CM | POA: Diagnosis not present

## 2020-07-31 DIAGNOSIS — Z01419 Encounter for gynecological examination (general) (routine) without abnormal findings: Secondary | ICD-10-CM | POA: Diagnosis not present

## 2020-07-31 DIAGNOSIS — N920 Excessive and frequent menstruation with regular cycle: Secondary | ICD-10-CM | POA: Diagnosis not present

## 2020-07-31 DIAGNOSIS — Z309 Encounter for contraceptive management, unspecified: Secondary | ICD-10-CM | POA: Diagnosis not present

## 2020-07-31 MED FILL — JUNEL FE 1.5/30 TABLET: 1.5-30 | 84 days supply | Qty: 84 | Fill #0

## 2020-09-12 MED FILL — MONTELUKAST SOD 10 MG TAB: 10 | 90 days supply | Qty: 90 | Fill #3

## 2020-09-12 MED FILL — PANTOPRAZOLE SOD DR 40 MG T: 40 | 90 days supply | Qty: 90 | Fill #1

## 2020-09-18 DIAGNOSIS — Z30430 Encounter for insertion of intrauterine contraceptive device: Secondary | ICD-10-CM | POA: Diagnosis not present

## 2020-09-18 DIAGNOSIS — Z3202 Encounter for pregnancy test, result negative: Secondary | ICD-10-CM | POA: Diagnosis not present

## 2020-10-12 ENCOUNTER — Other Ambulatory Visit (HOSPITAL_COMMUNITY): Payer: Self-pay

## 2020-10-12 DIAGNOSIS — R7303 Prediabetes: Secondary | ICD-10-CM | POA: Diagnosis not present

## 2020-10-12 DIAGNOSIS — Z0000 Encounter for general adult medical examination without abnormal findings: Secondary | ICD-10-CM | POA: Diagnosis not present

## 2020-10-12 DIAGNOSIS — Z1389 Encounter for screening for other disorder: Secondary | ICD-10-CM | POA: Diagnosis not present

## 2020-10-12 DIAGNOSIS — R030 Elevated blood-pressure reading, without diagnosis of hypertension: Secondary | ICD-10-CM | POA: Diagnosis not present

## 2020-10-12 DIAGNOSIS — E559 Vitamin D deficiency, unspecified: Secondary | ICD-10-CM | POA: Diagnosis not present

## 2020-10-12 DIAGNOSIS — D563 Thalassemia minor: Secondary | ICD-10-CM | POA: Diagnosis not present

## 2020-10-12 DIAGNOSIS — J309 Allergic rhinitis, unspecified: Secondary | ICD-10-CM | POA: Diagnosis not present

## 2020-10-12 DIAGNOSIS — Z13220 Encounter for screening for lipoid disorders: Secondary | ICD-10-CM | POA: Diagnosis not present

## 2020-10-12 DIAGNOSIS — D638 Anemia in other chronic diseases classified elsewhere: Secondary | ICD-10-CM | POA: Diagnosis not present

## 2020-10-12 MED ORDER — ERGOCALCIFEROL 1.25 MG (50000 UT) PO CAPS
50000.0000 [IU] | ORAL_CAPSULE | ORAL | 12 refills | Status: AC
  Filled 2020-10-12: qty 4, 28d supply, fill #0

## 2020-10-18 ENCOUNTER — Other Ambulatory Visit (HOSPITAL_COMMUNITY): Payer: Self-pay

## 2020-10-22 ENCOUNTER — Other Ambulatory Visit (HOSPITAL_COMMUNITY): Payer: Self-pay

## 2020-10-23 ENCOUNTER — Other Ambulatory Visit (HOSPITAL_COMMUNITY): Payer: Self-pay

## 2020-10-30 ENCOUNTER — Other Ambulatory Visit (HOSPITAL_COMMUNITY): Payer: Self-pay

## 2020-10-30 DIAGNOSIS — I10 Essential (primary) hypertension: Secondary | ICD-10-CM | POA: Diagnosis not present

## 2020-10-30 DIAGNOSIS — R319 Hematuria, unspecified: Secondary | ICD-10-CM | POA: Diagnosis not present

## 2020-10-30 DIAGNOSIS — Z30431 Encounter for routine checking of intrauterine contraceptive device: Secondary | ICD-10-CM | POA: Diagnosis not present

## 2020-10-30 MED ORDER — AMLODIPINE BESYLATE 5 MG PO TABS
ORAL_TABLET | ORAL | 6 refills | Status: AC
  Filled 2020-10-30: qty 30, 30d supply, fill #0

## 2020-11-06 ENCOUNTER — Other Ambulatory Visit (HOSPITAL_COMMUNITY): Payer: Self-pay

## 2020-11-06 MED ORDER — AMOXICILLIN-POT CLAVULANATE 500-125 MG PO TABS
ORAL_TABLET | ORAL | 0 refills | Status: AC
  Filled 2020-11-06: qty 14, 7d supply, fill #0

## 2020-11-07 ENCOUNTER — Other Ambulatory Visit (HOSPITAL_COMMUNITY): Payer: Self-pay

## 2020-11-08 ENCOUNTER — Other Ambulatory Visit (HOSPITAL_COMMUNITY): Payer: Self-pay

## 2020-11-14 ENCOUNTER — Other Ambulatory Visit (HOSPITAL_COMMUNITY): Payer: Self-pay

## 2020-11-14 DIAGNOSIS — E559 Vitamin D deficiency, unspecified: Secondary | ICD-10-CM | POA: Diagnosis not present

## 2020-11-14 DIAGNOSIS — R0681 Apnea, not elsewhere classified: Secondary | ICD-10-CM | POA: Diagnosis not present

## 2020-11-14 DIAGNOSIS — J309 Allergic rhinitis, unspecified: Secondary | ICD-10-CM | POA: Diagnosis not present

## 2020-11-14 DIAGNOSIS — G479 Sleep disorder, unspecified: Secondary | ICD-10-CM | POA: Diagnosis not present

## 2020-11-14 DIAGNOSIS — I10 Essential (primary) hypertension: Secondary | ICD-10-CM | POA: Diagnosis not present

## 2020-11-14 DIAGNOSIS — G4700 Insomnia, unspecified: Secondary | ICD-10-CM | POA: Diagnosis not present

## 2020-11-14 MED ORDER — CARVEDILOL 6.25 MG PO TABS
ORAL_TABLET | ORAL | 6 refills | Status: DC
  Filled 2020-11-14: qty 60, 30d supply, fill #0
  Filled 2020-12-09: qty 180, 90d supply, fill #1
  Filled 2021-02-26: qty 180, 90d supply, fill #2

## 2020-11-14 MED ORDER — AMLODIPINE BESYLATE 5 MG PO TABS
ORAL_TABLET | ORAL | 6 refills | Status: AC
  Filled 2020-11-14: qty 30, 30d supply, fill #0

## 2020-11-14 MED ORDER — ERGOCALCIFEROL 1.25 MG (50000 UT) PO CAPS
50000.0000 [IU] | ORAL_CAPSULE | ORAL | 4 refills | Status: AC
  Filled 2020-11-14: qty 12, 84d supply, fill #0

## 2020-11-14 MED ORDER — MONTELUKAST SODIUM 10 MG PO TABS
ORAL_TABLET | ORAL | 5 refills | Status: AC
  Filled 2020-11-14: qty 90, 90d supply, fill #0

## 2020-11-15 ENCOUNTER — Other Ambulatory Visit (HOSPITAL_BASED_OUTPATIENT_CLINIC_OR_DEPARTMENT_OTHER): Payer: Self-pay

## 2020-11-15 DIAGNOSIS — R5383 Other fatigue: Secondary | ICD-10-CM

## 2020-11-15 DIAGNOSIS — G4710 Hypersomnia, unspecified: Secondary | ICD-10-CM

## 2020-11-15 DIAGNOSIS — R0681 Apnea, not elsewhere classified: Secondary | ICD-10-CM

## 2020-11-15 DIAGNOSIS — R0683 Snoring: Secondary | ICD-10-CM

## 2020-11-15 DIAGNOSIS — G4700 Insomnia, unspecified: Secondary | ICD-10-CM

## 2020-12-03 ENCOUNTER — Other Ambulatory Visit: Payer: Self-pay

## 2020-12-03 ENCOUNTER — Ambulatory Visit (HOSPITAL_BASED_OUTPATIENT_CLINIC_OR_DEPARTMENT_OTHER): Payer: 59 | Attending: Internal Medicine | Admitting: Internal Medicine

## 2020-12-03 VITALS — Ht 67.0 in | Wt 164.0 lb

## 2020-12-03 DIAGNOSIS — R0683 Snoring: Secondary | ICD-10-CM

## 2020-12-03 DIAGNOSIS — G4736 Sleep related hypoventilation in conditions classified elsewhere: Secondary | ICD-10-CM | POA: Insufficient documentation

## 2020-12-03 DIAGNOSIS — R5383 Other fatigue: Secondary | ICD-10-CM

## 2020-12-03 DIAGNOSIS — G4710 Hypersomnia, unspecified: Secondary | ICD-10-CM

## 2020-12-03 DIAGNOSIS — R0902 Hypoxemia: Secondary | ICD-10-CM | POA: Insufficient documentation

## 2020-12-03 DIAGNOSIS — G4733 Obstructive sleep apnea (adult) (pediatric): Secondary | ICD-10-CM | POA: Insufficient documentation

## 2020-12-03 DIAGNOSIS — R0681 Apnea, not elsewhere classified: Secondary | ICD-10-CM

## 2020-12-03 DIAGNOSIS — G4700 Insomnia, unspecified: Secondary | ICD-10-CM

## 2020-12-04 ENCOUNTER — Other Ambulatory Visit (HOSPITAL_BASED_OUTPATIENT_CLINIC_OR_DEPARTMENT_OTHER): Payer: Self-pay

## 2020-12-04 ENCOUNTER — Ambulatory Visit: Payer: 59 | Attending: Internal Medicine

## 2020-12-04 DIAGNOSIS — Z23 Encounter for immunization: Secondary | ICD-10-CM

## 2020-12-04 MED ORDER — COVID-19 MRNA VACC (MODERNA) 100 MCG/0.5ML IM SUSP
INTRAMUSCULAR | 0 refills | Status: AC
  Filled 2020-12-04: qty 0.25, 1d supply, fill #0

## 2020-12-04 NOTE — Progress Notes (Signed)
   Covid-19 Vaccination Clinic  Name:  Shauni Henner    MRN: [id number] DOB: 1975/07/18  12/04/2020  Ms. Roark was observed post Covid-19 immunization for 15 minutes without incident. She was provided with Vaccine Information Sheet and instruction to access the V-Safe system.   Ms. Walla was instructed to call 911 with any severe reactions post vaccine: Difficulty breathing  Swelling of face and throat  A fast heartbeat  A bad rash all over body  Dizziness and weakness   Immunizations Administered     Name Date Dose VIS Date Route   Moderna Covid-19 Booster Vaccine 12/04/2020  9:07 AM 0.25 mL 04/11/2020 Intramuscular   Manufacturer: Moderna   Lot: [id number]   [place]: [id number]

## 2020-12-08 DIAGNOSIS — G4733 Obstructive sleep apnea (adult) (pediatric): Secondary | ICD-10-CM | POA: Diagnosis not present

## 2020-12-10 ENCOUNTER — Other Ambulatory Visit (HOSPITAL_COMMUNITY): Payer: Self-pay

## 2020-12-11 ENCOUNTER — Other Ambulatory Visit (HOSPITAL_COMMUNITY): Payer: Self-pay

## 2020-12-19 NOTE — Procedures (Signed)
    NAME: Debra Austin DATE OF BIRTH:  October 18, 1975 MEDICAL RECORD NUMBER [id number]  LOCATION: [facility]  PHYSICIAN: Marius Ditch  DATE OF STUDY: 12/03/2020  SLEEP STUDY TYPE: Out of Center Sleep Test                REFERRING PHYSICIAN: Marius Ditch, MD/Karrar Lysle Rubens, MD  EPWORTH SLEEPINESS SCORE:  4 HEIGHT: 5\' 7"  (170.2 cm)  WEIGHT: 164 lb (74.4 kg)    Body mass index is 25.69 kg/m.  NECK SIZE: 17 in.  CLINICAL INFORMATION The patient was referred to the sleep center for home sleep apnea testing. She has witnessed apnea, snoring, elevated blood pressure  MEDICATIONS No sleep medicine administered.Marland Kitchen  SLEEP STUDY TECHNIQUE A multi-channel overnight portable sleep study was performed. The channels recorded were: nasal and oral airflow, thoracic and abdominal respiratory movement, and oxygen saturation with a pulse oximetry. Snoring and body position were also monitored.  TECHNICIAN COMMENTS Comments added by Technician: N/A Comments added by Scorer: N/A  RECORDING SUMMARY The study was initiated at 11:41:10 PM and terminated at 7:30:28 AM. The total recorded time was 469.3 minutes.  RESPIRATORY PARAMETERS The overall AHI was 39.9 per hour, with a central apnea index of 0 per hour. The patient was supine for 75%. The oxygen nadir was 75% during sleep. O2 sat was below 89% for 7 minutes of the night. The pattern of sleep disordered breathing showed worsening in what was likely REM sleep  CARDIAC DATA Mean heart rate during sleep was 77.4 bpm.  IMPRESSIONS - Severe Obstructive Sleep Apnea (OSA) - Severe hypoxemia  DIAGNOSIS - Obstructive Sleep Apnea (G47.33) - Nocturnal Hypoxemia (G47.36)  RECOMMENDATIONS - The patient should be treated with CPAP. This could be started with auto adjusting CPAP in the home or by in lab CPAP titration.   Marius Ditch Sleep specialist, [place] [employer]  ELECTRONICALLY SIGNED ON:   12/19/2020, 6:30 AM [place] PH: [phone]   FX: [phone] [place]

## 2020-12-21 ENCOUNTER — Other Ambulatory Visit (HOSPITAL_COMMUNITY): Payer: Self-pay

## 2020-12-21 MED ORDER — ZOLPIDEM TARTRATE 5 MG PO TABS
5.0000 mg | ORAL_TABLET | Freq: Every evening | ORAL | 3 refills | Status: AC | PRN
  Filled 2020-12-21: qty 30, 30d supply, fill #0
  Filled 2021-01-28: qty 30, 30d supply, fill #1
  Filled 2021-04-17: qty 30, 30d supply, fill #2

## 2020-12-25 DIAGNOSIS — G4733 Obstructive sleep apnea (adult) (pediatric): Secondary | ICD-10-CM | POA: Diagnosis not present

## 2021-01-01 ENCOUNTER — Other Ambulatory Visit (HOSPITAL_COMMUNITY): Payer: Self-pay

## 2021-01-01 MED ORDER — PAXLOVID 20 X 150 MG & 10 X 100MG PO TBPK
ORAL_TABLET | ORAL | 0 refills | Status: AC
  Filled 2021-01-02: qty 30, 5d supply, fill #0

## 2021-01-02 ENCOUNTER — Other Ambulatory Visit (HOSPITAL_COMMUNITY): Payer: Self-pay

## 2021-01-10 DIAGNOSIS — I10 Essential (primary) hypertension: Secondary | ICD-10-CM | POA: Diagnosis not present

## 2021-01-10 DIAGNOSIS — G4733 Obstructive sleep apnea (adult) (pediatric): Secondary | ICD-10-CM | POA: Diagnosis not present

## 2021-01-12 ENCOUNTER — Other Ambulatory Visit (HOSPITAL_COMMUNITY): Payer: Self-pay

## 2021-01-12 ENCOUNTER — Other Ambulatory Visit: Payer: Self-pay

## 2021-01-14 ENCOUNTER — Other Ambulatory Visit (HOSPITAL_COMMUNITY): Payer: Self-pay

## 2021-01-14 MED ORDER — PANTOPRAZOLE SODIUM 40 MG PO TBEC
DELAYED_RELEASE_TABLET | ORAL | 6 refills | Status: AC
  Filled 2021-01-14: qty 30, 30d supply, fill #0

## 2021-01-15 ENCOUNTER — Other Ambulatory Visit (HOSPITAL_COMMUNITY): Payer: Self-pay

## 2021-01-25 DIAGNOSIS — G4733 Obstructive sleep apnea (adult) (pediatric): Secondary | ICD-10-CM | POA: Diagnosis not present

## 2021-01-28 ENCOUNTER — Other Ambulatory Visit (HOSPITAL_COMMUNITY): Payer: Self-pay

## 2021-01-28 MED FILL — Ketoconazole Shampoo 2%: CUTANEOUS | 30 days supply | Qty: 120 | Fill #0 | Status: AC

## 2021-01-29 ENCOUNTER — Other Ambulatory Visit (HOSPITAL_COMMUNITY): Payer: Self-pay

## 2021-01-29 DIAGNOSIS — G4733 Obstructive sleep apnea (adult) (pediatric): Secondary | ICD-10-CM | POA: Diagnosis not present

## 2021-02-06 ENCOUNTER — Other Ambulatory Visit (HOSPITAL_COMMUNITY): Payer: Self-pay

## 2021-02-06 DIAGNOSIS — H5213 Myopia, bilateral: Secondary | ICD-10-CM | POA: Diagnosis not present

## 2021-02-06 MED ORDER — ZOLPIDEM TARTRATE ER 6.25 MG PO TBCR
EXTENDED_RELEASE_TABLET | ORAL | 3 refills | Status: DC
  Filled 2021-02-06: qty 20, 30d supply, fill #0
  Filled 2021-05-21: qty 20, 30d supply, fill #1
  Filled 2021-07-23: qty 20, 30d supply, fill #2

## 2021-02-07 ENCOUNTER — Other Ambulatory Visit (HOSPITAL_COMMUNITY): Payer: Self-pay

## 2021-02-13 DIAGNOSIS — G4733 Obstructive sleep apnea (adult) (pediatric): Secondary | ICD-10-CM | POA: Diagnosis not present

## 2021-02-13 DIAGNOSIS — G4700 Insomnia, unspecified: Secondary | ICD-10-CM | POA: Diagnosis not present

## 2021-02-25 DIAGNOSIS — G4733 Obstructive sleep apnea (adult) (pediatric): Secondary | ICD-10-CM | POA: Diagnosis not present

## 2021-02-26 ENCOUNTER — Other Ambulatory Visit (HOSPITAL_COMMUNITY): Payer: Self-pay

## 2021-03-15 ENCOUNTER — Other Ambulatory Visit (HOSPITAL_COMMUNITY): Payer: Self-pay

## 2021-03-19 ENCOUNTER — Other Ambulatory Visit (HOSPITAL_COMMUNITY): Payer: Self-pay

## 2021-03-27 DIAGNOSIS — G4733 Obstructive sleep apnea (adult) (pediatric): Secondary | ICD-10-CM | POA: Diagnosis not present

## 2021-03-28 DIAGNOSIS — G4733 Obstructive sleep apnea (adult) (pediatric): Secondary | ICD-10-CM | POA: Diagnosis not present

## 2021-04-17 ENCOUNTER — Other Ambulatory Visit (HOSPITAL_COMMUNITY): Payer: Self-pay

## 2021-04-25 ENCOUNTER — Other Ambulatory Visit: Payer: Self-pay | Admitting: Obstetrics and Gynecology

## 2021-04-25 DIAGNOSIS — Z1231 Encounter for screening mammogram for malignant neoplasm of breast: Secondary | ICD-10-CM

## 2021-04-30 DIAGNOSIS — G4733 Obstructive sleep apnea (adult) (pediatric): Secondary | ICD-10-CM | POA: Diagnosis not present

## 2021-05-03 ENCOUNTER — Ambulatory Visit: Payer: 59 | Attending: Internal Medicine

## 2021-05-03 ENCOUNTER — Other Ambulatory Visit: Payer: Self-pay

## 2021-05-03 ENCOUNTER — Other Ambulatory Visit (HOSPITAL_BASED_OUTPATIENT_CLINIC_OR_DEPARTMENT_OTHER): Payer: Self-pay

## 2021-05-03 DIAGNOSIS — Z23 Encounter for immunization: Secondary | ICD-10-CM

## 2021-05-03 MED ORDER — MODERNA COVID-19 BIVAL BOOSTER 50 MCG/0.5ML IM SUSP
INTRAMUSCULAR | 0 refills | Status: AC
  Filled 2021-05-03: qty 0.5, 1d supply, fill #0

## 2021-05-03 NOTE — Progress Notes (Signed)
   Covid-19 Vaccination Clinic  Name:  Mohini Heathcock    MRN: [id number] DOB: [birth year]  05/03/2021  Ms. Lamontagne was observed post Covid-19 immunization for 15 minutes without incident. She was provided with Vaccine Information Sheet and instruction to access the V-Safe system.   Ms. Holthaus was instructed to call 911 with any severe reactions post vaccine: Difficulty breathing  Swelling of face and throat  A fast heartbeat  A bad rash all over body  Dizziness and weakness   Immunizations Administered     Name Date Dose VIS Date Route   Moderna Covid-19 vaccine Bivalent Booster 05/03/2021  2:38 PM 0.5 mL 02/02/2021 Intramuscular   Manufacturer: Moderna   Lot: [id number]   [place]: [id number]

## 2021-05-21 ENCOUNTER — Other Ambulatory Visit (HOSPITAL_COMMUNITY): Payer: Self-pay

## 2021-05-22 ENCOUNTER — Other Ambulatory Visit (HOSPITAL_COMMUNITY): Payer: Self-pay

## 2021-05-22 MED ORDER — CARVEDILOL 6.25 MG PO TABS
ORAL_TABLET | ORAL | 6 refills | Status: AC
  Filled 2021-05-22: qty 60, 30d supply, fill #0
  Filled 2021-07-01: qty 180, 90d supply, fill #1

## 2021-05-27 ENCOUNTER — Other Ambulatory Visit (HOSPITAL_COMMUNITY): Payer: Self-pay

## 2021-05-27 MED ORDER — AZITHROMYCIN 250 MG PO TABS
ORAL_TABLET | ORAL | 0 refills | Status: AC
  Filled 2021-05-27: qty 6, 5d supply, fill #0

## 2021-06-04 ENCOUNTER — Other Ambulatory Visit (HOSPITAL_COMMUNITY): Payer: Self-pay

## 2021-06-18 ENCOUNTER — Other Ambulatory Visit: Payer: Self-pay

## 2021-06-18 ENCOUNTER — Ambulatory Visit
Admission: RE | Admit: 2021-06-18 | Discharge: 2021-06-18 | Disposition: A | Discharge status: Home / Self Care | Payer: 59 | Source: Ambulatory Visit | Attending: Obstetrics and Gynecology | Admitting: Obstetrics and Gynecology

## 2021-06-18 DIAGNOSIS — Z1231 Encounter for screening mammogram for malignant neoplasm of breast: Secondary | ICD-10-CM | POA: Diagnosis not present

## 2021-06-20 DIAGNOSIS — G4733 Obstructive sleep apnea (adult) (pediatric): Secondary | ICD-10-CM | POA: Diagnosis not present

## 2021-07-01 ENCOUNTER — Other Ambulatory Visit (HOSPITAL_COMMUNITY): Payer: Self-pay

## 2021-07-24 ENCOUNTER — Other Ambulatory Visit (HOSPITAL_COMMUNITY): Payer: Self-pay

## 2021-07-25 DIAGNOSIS — G4733 Obstructive sleep apnea (adult) (pediatric): Secondary | ICD-10-CM | POA: Diagnosis not present

## 2021-08-15 DIAGNOSIS — R232 Flushing: Secondary | ICD-10-CM | POA: Diagnosis not present

## 2021-08-15 DIAGNOSIS — Z01419 Encounter for gynecological examination (general) (routine) without abnormal findings: Secondary | ICD-10-CM | POA: Diagnosis not present

## 2021-09-03 ENCOUNTER — Other Ambulatory Visit (HOSPITAL_COMMUNITY): Payer: Self-pay

## 2021-09-03 MED ORDER — ZOLPIDEM TARTRATE ER 6.25 MG PO TBCR
EXTENDED_RELEASE_TABLET | ORAL | 3 refills | Status: AC
  Filled 2021-09-03: qty 20, 30d supply, fill #0
  Filled 2021-12-28: qty 20, 30d supply, fill #1
  Filled 2022-02-19: qty 20, 30d supply, fill #2

## 2021-09-05 ENCOUNTER — Other Ambulatory Visit (HOSPITAL_COMMUNITY): Payer: Self-pay

## 2021-09-09 ENCOUNTER — Other Ambulatory Visit (HOSPITAL_COMMUNITY): Payer: Self-pay

## 2021-09-09 DIAGNOSIS — F411 Generalized anxiety disorder: Secondary | ICD-10-CM | POA: Diagnosis not present

## 2021-09-09 DIAGNOSIS — I10 Essential (primary) hypertension: Secondary | ICD-10-CM | POA: Diagnosis not present

## 2021-09-09 DIAGNOSIS — N951 Menopausal and female climacteric states: Secondary | ICD-10-CM | POA: Diagnosis not present

## 2021-09-09 MED ORDER — TRAZODONE HCL 50 MG PO TABS
ORAL_TABLET | ORAL | 4 refills | Status: AC
  Filled 2021-09-09: qty 30, 30d supply, fill #0

## 2021-09-09 MED ORDER — CARVEDILOL 25 MG PO TABS
ORAL_TABLET | ORAL | 4 refills | Status: DC
  Filled 2021-09-09: qty 180, 90d supply, fill #0
  Filled 2021-11-25: qty 180, 90d supply, fill #1
  Filled 2022-02-17: qty 180, 90d supply, fill #2
  Filled 2022-05-19: qty 180, 90d supply, fill #3
  Filled 2022-08-24: qty 180, 90d supply, fill #4

## 2021-09-14 DIAGNOSIS — G4733 Obstructive sleep apnea (adult) (pediatric): Secondary | ICD-10-CM | POA: Diagnosis not present

## 2021-09-30 DIAGNOSIS — G4733 Obstructive sleep apnea (adult) (pediatric): Secondary | ICD-10-CM | POA: Diagnosis not present

## 2021-10-17 DIAGNOSIS — R7303 Prediabetes: Secondary | ICD-10-CM | POA: Diagnosis not present

## 2021-10-17 DIAGNOSIS — J309 Allergic rhinitis, unspecified: Secondary | ICD-10-CM | POA: Diagnosis not present

## 2021-10-17 DIAGNOSIS — D563 Thalassemia minor: Secondary | ICD-10-CM | POA: Diagnosis not present

## 2021-10-17 DIAGNOSIS — I10 Essential (primary) hypertension: Secondary | ICD-10-CM | POA: Diagnosis not present

## 2021-10-17 DIAGNOSIS — Z1389 Encounter for screening for other disorder: Secondary | ICD-10-CM | POA: Diagnosis not present

## 2021-10-17 DIAGNOSIS — Z0000 Encounter for general adult medical examination without abnormal findings: Secondary | ICD-10-CM | POA: Diagnosis not present

## 2021-10-17 DIAGNOSIS — E559 Vitamin D deficiency, unspecified: Secondary | ICD-10-CM | POA: Diagnosis not present

## 2021-10-17 DIAGNOSIS — Z13220 Encounter for screening for lipoid disorders: Secondary | ICD-10-CM | POA: Diagnosis not present

## 2021-10-17 DIAGNOSIS — G4733 Obstructive sleep apnea (adult) (pediatric): Secondary | ICD-10-CM | POA: Diagnosis not present

## 2021-10-18 ENCOUNTER — Other Ambulatory Visit (HOSPITAL_COMMUNITY): Payer: Self-pay

## 2021-10-18 MED ORDER — ERGOCALCIFEROL 1.25 MG (50000 UT) PO CAPS
50000.0000 [IU] | ORAL_CAPSULE | ORAL | 4 refills | Status: AC
  Filled 2021-10-18: qty 12, 84d supply, fill #0
  Filled 2022-03-25: qty 12, 84d supply, fill #1

## 2021-10-26 ENCOUNTER — Other Ambulatory Visit (HOSPITAL_COMMUNITY): Payer: Self-pay

## 2021-11-19 DIAGNOSIS — G4733 Obstructive sleep apnea (adult) (pediatric): Secondary | ICD-10-CM | POA: Diagnosis not present

## 2021-11-25 ENCOUNTER — Other Ambulatory Visit (HOSPITAL_COMMUNITY): Payer: Self-pay

## 2021-12-07 DIAGNOSIS — G4733 Obstructive sleep apnea (adult) (pediatric): Secondary | ICD-10-CM | POA: Diagnosis not present

## 2021-12-28 ENCOUNTER — Other Ambulatory Visit (HOSPITAL_COMMUNITY): Payer: Self-pay

## 2022-01-14 ENCOUNTER — Other Ambulatory Visit (HOSPITAL_COMMUNITY): Payer: Self-pay

## 2022-01-14 MED ORDER — KETOCONAZOLE 2 % EX SHAM
MEDICATED_SHAMPOO | CUTANEOUS | 5 refills | Status: DC
  Filled 2022-01-14 – 2022-01-23 (×2): qty 120, 30d supply, fill #0
  Filled 2022-11-18: qty 120, 30d supply, fill #1

## 2022-01-22 ENCOUNTER — Other Ambulatory Visit (HOSPITAL_COMMUNITY): Payer: Self-pay

## 2022-01-23 ENCOUNTER — Other Ambulatory Visit (HOSPITAL_COMMUNITY): Payer: Self-pay

## 2022-01-24 DIAGNOSIS — R7303 Prediabetes: Secondary | ICD-10-CM | POA: Diagnosis not present

## 2022-01-25 ENCOUNTER — Other Ambulatory Visit (HOSPITAL_COMMUNITY): Payer: Self-pay

## 2022-02-10 DIAGNOSIS — G4733 Obstructive sleep apnea (adult) (pediatric): Secondary | ICD-10-CM | POA: Diagnosis not present

## 2022-02-12 DIAGNOSIS — H5213 Myopia, bilateral: Secondary | ICD-10-CM | POA: Diagnosis not present

## 2022-02-12 DIAGNOSIS — G4733 Obstructive sleep apnea (adult) (pediatric): Secondary | ICD-10-CM | POA: Diagnosis not present

## 2022-02-17 ENCOUNTER — Other Ambulatory Visit (HOSPITAL_COMMUNITY): Payer: Self-pay

## 2022-02-19 ENCOUNTER — Other Ambulatory Visit (HOSPITAL_COMMUNITY): Payer: Self-pay

## 2022-03-04 ENCOUNTER — Other Ambulatory Visit (HOSPITAL_COMMUNITY): Payer: Self-pay

## 2022-03-04 DIAGNOSIS — N898 Other specified noninflammatory disorders of vagina: Secondary | ICD-10-CM | POA: Diagnosis not present

## 2022-03-04 DIAGNOSIS — Z23 Encounter for immunization: Secondary | ICD-10-CM | POA: Diagnosis not present

## 2022-03-04 DIAGNOSIS — N760 Acute vaginitis: Secondary | ICD-10-CM | POA: Diagnosis not present

## 2022-03-04 MED ORDER — CLOBETASOL PROPIONATE 0.05 % EX OINT
TOPICAL_OINTMENT | CUTANEOUS | 2 refills | Status: AC
  Filled 2022-03-04: qty 30, 14d supply, fill #0

## 2022-03-04 MED ORDER — FLUCONAZOLE 150 MG PO TABS
ORAL_TABLET | ORAL | 1 refills | Status: AC
  Filled 2022-03-04: qty 2, 3d supply, fill #0

## 2022-03-17 DIAGNOSIS — G4733 Obstructive sleep apnea (adult) (pediatric): Secondary | ICD-10-CM | POA: Diagnosis not present

## 2022-03-24 DIAGNOSIS — G4733 Obstructive sleep apnea (adult) (pediatric): Secondary | ICD-10-CM | POA: Diagnosis not present

## 2022-03-25 ENCOUNTER — Other Ambulatory Visit (HOSPITAL_COMMUNITY): Payer: Self-pay

## 2022-04-23 DIAGNOSIS — G4733 Obstructive sleep apnea (adult) (pediatric): Secondary | ICD-10-CM | POA: Diagnosis not present

## 2022-04-23 DIAGNOSIS — J309 Allergic rhinitis, unspecified: Secondary | ICD-10-CM | POA: Diagnosis not present

## 2022-04-23 DIAGNOSIS — R7303 Prediabetes: Secondary | ICD-10-CM | POA: Diagnosis not present

## 2022-04-23 DIAGNOSIS — D563 Thalassemia minor: Secondary | ICD-10-CM | POA: Diagnosis not present

## 2022-04-23 DIAGNOSIS — I10 Essential (primary) hypertension: Secondary | ICD-10-CM | POA: Diagnosis not present

## 2022-05-08 ENCOUNTER — Other Ambulatory Visit: Payer: Self-pay | Admitting: Obstetrics and Gynecology

## 2022-05-08 DIAGNOSIS — Z1231 Encounter for screening mammogram for malignant neoplasm of breast: Secondary | ICD-10-CM

## 2022-05-12 DIAGNOSIS — G4733 Obstructive sleep apnea (adult) (pediatric): Secondary | ICD-10-CM | POA: Diagnosis not present

## 2022-05-19 ENCOUNTER — Other Ambulatory Visit (HOSPITAL_COMMUNITY): Payer: Self-pay

## 2022-05-21 ENCOUNTER — Other Ambulatory Visit (HOSPITAL_COMMUNITY): Payer: Self-pay

## 2022-05-21 MED ORDER — ZOLPIDEM TARTRATE ER 6.25 MG PO TBCR
6.2500 mg | EXTENDED_RELEASE_TABLET | Freq: Every evening | ORAL | 3 refills | Status: DC | PRN
  Filled 2022-05-21: qty 20, 30d supply, fill #0
  Filled 2022-08-24: qty 20, 30d supply, fill #1
  Filled 2022-11-14: qty 20, 30d supply, fill #2

## 2022-05-22 ENCOUNTER — Other Ambulatory Visit (HOSPITAL_COMMUNITY): Payer: Self-pay

## 2022-05-23 ENCOUNTER — Other Ambulatory Visit (HOSPITAL_COMMUNITY): Payer: Self-pay

## 2022-05-27 ENCOUNTER — Other Ambulatory Visit (HOSPITAL_COMMUNITY): Payer: Self-pay

## 2022-06-09 DIAGNOSIS — G4733 Obstructive sleep apnea (adult) (pediatric): Secondary | ICD-10-CM | POA: Diagnosis not present

## 2022-06-18 DIAGNOSIS — G4733 Obstructive sleep apnea (adult) (pediatric): Secondary | ICD-10-CM | POA: Diagnosis not present

## 2022-06-26 ENCOUNTER — Other Ambulatory Visit (HOSPITAL_COMMUNITY): Payer: Self-pay

## 2022-07-01 ENCOUNTER — Ambulatory Visit: Payer: Self-pay

## 2022-07-18 ENCOUNTER — Ambulatory Visit: Payer: Self-pay

## 2022-07-18 ENCOUNTER — Ambulatory Visit
Admission: RE | Admit: 2022-07-18 | Discharge: 2022-07-18 | Disposition: A | Discharge status: Home / Self Care | Payer: Commercial Managed Care - PPO | Source: Ambulatory Visit | Attending: Obstetrics and Gynecology | Admitting: Obstetrics and Gynecology

## 2022-07-18 DIAGNOSIS — Z1231 Encounter for screening mammogram for malignant neoplasm of breast: Secondary | ICD-10-CM

## 2022-08-25 ENCOUNTER — Other Ambulatory Visit: Payer: Self-pay

## 2022-08-25 DIAGNOSIS — Z30431 Encounter for routine checking of intrauterine contraceptive device: Secondary | ICD-10-CM | POA: Diagnosis not present

## 2022-08-25 DIAGNOSIS — Z01419 Encounter for gynecological examination (general) (routine) without abnormal findings: Secondary | ICD-10-CM | POA: Diagnosis not present

## 2022-08-25 DIAGNOSIS — N951 Menopausal and female climacteric states: Secondary | ICD-10-CM | POA: Diagnosis not present

## 2022-09-14 DIAGNOSIS — G4733 Obstructive sleep apnea (adult) (pediatric): Secondary | ICD-10-CM | POA: Diagnosis not present

## 2022-09-16 ENCOUNTER — Other Ambulatory Visit (HOSPITAL_COMMUNITY): Payer: Self-pay

## 2022-09-16 MED ORDER — PANTOPRAZOLE SODIUM 40 MG PO TBEC
40.0000 mg | DELAYED_RELEASE_TABLET | Freq: Every day | ORAL | 3 refills | Status: AC
  Filled 2022-09-16: qty 90, 90d supply, fill #0

## 2022-10-15 DIAGNOSIS — G4733 Obstructive sleep apnea (adult) (pediatric): Secondary | ICD-10-CM | POA: Diagnosis not present

## 2022-10-20 DIAGNOSIS — Z0000 Encounter for general adult medical examination without abnormal findings: Secondary | ICD-10-CM | POA: Diagnosis not present

## 2022-10-20 DIAGNOSIS — J309 Allergic rhinitis, unspecified: Secondary | ICD-10-CM | POA: Diagnosis not present

## 2022-10-20 DIAGNOSIS — E559 Vitamin D deficiency, unspecified: Secondary | ICD-10-CM | POA: Diagnosis not present

## 2022-10-20 DIAGNOSIS — Z1389 Encounter for screening for other disorder: Secondary | ICD-10-CM | POA: Diagnosis not present

## 2022-10-20 DIAGNOSIS — G4733 Obstructive sleep apnea (adult) (pediatric): Secondary | ICD-10-CM | POA: Diagnosis not present

## 2022-10-20 DIAGNOSIS — Z13220 Encounter for screening for lipoid disorders: Secondary | ICD-10-CM | POA: Diagnosis not present

## 2022-10-20 DIAGNOSIS — R7303 Prediabetes: Secondary | ICD-10-CM | POA: Diagnosis not present

## 2022-10-20 DIAGNOSIS — I10 Essential (primary) hypertension: Secondary | ICD-10-CM | POA: Diagnosis not present

## 2022-10-21 DIAGNOSIS — G4733 Obstructive sleep apnea (adult) (pediatric): Secondary | ICD-10-CM | POA: Diagnosis not present

## 2022-11-14 ENCOUNTER — Other Ambulatory Visit (HOSPITAL_COMMUNITY): Payer: Self-pay

## 2022-11-14 DIAGNOSIS — G4733 Obstructive sleep apnea (adult) (pediatric): Secondary | ICD-10-CM | POA: Diagnosis not present

## 2022-11-18 ENCOUNTER — Other Ambulatory Visit (HOSPITAL_COMMUNITY): Payer: Self-pay

## 2022-11-18 MED ORDER — CARVEDILOL 25 MG PO TABS
25.0000 mg | ORAL_TABLET | Freq: Two times a day (BID) | ORAL | 4 refills | Status: AC
  Filled 2022-11-18: qty 180, 90d supply, fill #0
  Filled 2023-02-16: qty 120, 60d supply, fill #1
  Filled 2023-02-16: qty 60, 30d supply, fill #1
  Filled 2023-05-05: qty 180, 90d supply, fill #2

## 2022-11-21 ENCOUNTER — Other Ambulatory Visit (HOSPITAL_COMMUNITY): Payer: Self-pay

## 2022-11-22 ENCOUNTER — Other Ambulatory Visit (HOSPITAL_COMMUNITY): Payer: Self-pay

## 2023-01-04 ENCOUNTER — Other Ambulatory Visit (HOSPITAL_COMMUNITY): Payer: Self-pay

## 2023-01-05 ENCOUNTER — Other Ambulatory Visit (HOSPITAL_COMMUNITY): Payer: Self-pay

## 2023-01-05 MED ORDER — ZOLPIDEM TARTRATE ER 6.25 MG PO TBCR
6.2500 mg | EXTENDED_RELEASE_TABLET | Freq: Every evening | ORAL | 3 refills | Status: AC | PRN
  Filled 2023-01-05: qty 20, 20d supply, fill #0
  Filled 2023-02-16: qty 20, 20d supply, fill #1
  Filled 2023-05-25: qty 20, 20d supply, fill #2

## 2023-01-21 ENCOUNTER — Other Ambulatory Visit (HOSPITAL_COMMUNITY): Payer: Self-pay

## 2023-01-28 DIAGNOSIS — G4733 Obstructive sleep apnea (adult) (pediatric): Secondary | ICD-10-CM | POA: Diagnosis not present

## 2023-02-03 ENCOUNTER — Other Ambulatory Visit (HOSPITAL_COMMUNITY): Payer: Self-pay

## 2023-02-03 MED ORDER — AZITHROMYCIN 250 MG PO TABS
ORAL_TABLET | ORAL | 0 refills | Status: AC
  Filled 2023-02-03: qty 6, 5d supply, fill #0

## 2023-02-04 ENCOUNTER — Other Ambulatory Visit (HOSPITAL_COMMUNITY): Payer: Self-pay

## 2023-02-04 MED ORDER — BENZONATATE 200 MG PO CAPS
200.0000 mg | ORAL_CAPSULE | Freq: Three times a day (TID) | ORAL | 1 refills | Status: AC | PRN
  Filled 2023-02-04: qty 30, 10d supply, fill #0

## 2023-02-05 ENCOUNTER — Other Ambulatory Visit (HOSPITAL_COMMUNITY): Payer: Self-pay

## 2023-02-16 ENCOUNTER — Other Ambulatory Visit (HOSPITAL_COMMUNITY): Payer: Self-pay

## 2023-02-16 ENCOUNTER — Other Ambulatory Visit: Payer: Self-pay

## 2023-02-16 MED ORDER — KETOCONAZOLE 2 % EX SHAM
1.0000 | MEDICATED_SHAMPOO | Freq: Every day | CUTANEOUS | 2 refills | Status: AC
  Filled 2023-02-16: qty 120, 30d supply, fill #0

## 2023-02-17 ENCOUNTER — Other Ambulatory Visit (HOSPITAL_COMMUNITY): Payer: Self-pay

## 2023-02-18 DIAGNOSIS — I10 Essential (primary) hypertension: Secondary | ICD-10-CM | POA: Diagnosis not present

## 2023-02-18 DIAGNOSIS — G4733 Obstructive sleep apnea (adult) (pediatric): Secondary | ICD-10-CM | POA: Diagnosis not present

## 2023-02-28 DIAGNOSIS — G4733 Obstructive sleep apnea (adult) (pediatric): Secondary | ICD-10-CM | POA: Diagnosis not present

## 2023-03-25 DIAGNOSIS — G4733 Obstructive sleep apnea (adult) (pediatric): Secondary | ICD-10-CM | POA: Diagnosis not present

## 2023-03-30 ENCOUNTER — Other Ambulatory Visit (HOSPITAL_COMMUNITY): Payer: Self-pay

## 2023-03-30 DIAGNOSIS — G4733 Obstructive sleep apnea (adult) (pediatric): Secondary | ICD-10-CM | POA: Diagnosis not present

## 2023-03-30 DIAGNOSIS — H5213 Myopia, bilateral: Secondary | ICD-10-CM | POA: Diagnosis not present

## 2023-03-30 DIAGNOSIS — H04123 Dry eye syndrome of bilateral lacrimal glands: Secondary | ICD-10-CM | POA: Diagnosis not present

## 2023-04-02 ENCOUNTER — Other Ambulatory Visit (HOSPITAL_COMMUNITY): Payer: Self-pay

## 2023-04-04 ENCOUNTER — Other Ambulatory Visit (HOSPITAL_COMMUNITY): Payer: Self-pay

## 2023-04-20 DIAGNOSIS — R7303 Prediabetes: Secondary | ICD-10-CM | POA: Diagnosis not present

## 2023-04-29 DIAGNOSIS — G4733 Obstructive sleep apnea (adult) (pediatric): Secondary | ICD-10-CM | POA: Diagnosis not present

## 2023-05-05 ENCOUNTER — Other Ambulatory Visit (HOSPITAL_COMMUNITY): Payer: Self-pay

## 2023-05-14 ENCOUNTER — Other Ambulatory Visit (HOSPITAL_COMMUNITY): Payer: Self-pay

## 2023-05-14 DIAGNOSIS — L608 Other nail disorders: Secondary | ICD-10-CM | POA: Diagnosis not present

## 2023-05-14 DIAGNOSIS — L821 Other seborrheic keratosis: Secondary | ICD-10-CM | POA: Diagnosis not present

## 2023-05-14 DIAGNOSIS — B351 Tinea unguium: Secondary | ICD-10-CM | POA: Diagnosis not present

## 2023-05-14 MED ORDER — CICLOPIROX 8 % EX SOLN
Freq: Every day | CUTANEOUS | 11 refills | Status: AC
  Filled 2023-05-14: qty 6.6, 30d supply, fill #0
  Filled 2023-10-06: qty 6.6, 30d supply, fill #1

## 2023-05-25 ENCOUNTER — Other Ambulatory Visit (HOSPITAL_COMMUNITY): Payer: Self-pay

## 2023-05-26 ENCOUNTER — Other Ambulatory Visit (HOSPITAL_COMMUNITY): Payer: Self-pay

## 2023-07-14 ENCOUNTER — Other Ambulatory Visit: Payer: Self-pay | Admitting: Internal Medicine

## 2023-07-14 DIAGNOSIS — Z1231 Encounter for screening mammogram for malignant neoplasm of breast: Secondary | ICD-10-CM

## 2023-07-23 ENCOUNTER — Ambulatory Visit
Admission: RE | Admit: 2023-07-23 | Discharge: 2023-07-23 | Disposition: A | Discharge status: Home / Self Care | Payer: 59 | Source: Ambulatory Visit | Attending: Internal Medicine | Admitting: Internal Medicine

## 2023-07-23 DIAGNOSIS — Z1231 Encounter for screening mammogram for malignant neoplasm of breast: Secondary | ICD-10-CM

## 2023-09-28 IMAGING — MG MM DIGITAL SCREENING BILAT W/ TOMO AND CAD
6 of 12 series · 6 of 36 positions shown · non-contrast
Comparison: Previous exam(s).

CLINICAL DATA: Screening.

EXAM:
DIGITAL SCREENING BILATERAL MAMMOGRAM WITH TOMOSYNTHESIS AND CAD
TECHNIQUE: Bilateral screening digital craniocaudal and mediolateral oblique
mammograms were obtained. Bilateral screening digital breast
tomosynthesis was performed. The images were evaluated with
computer-aided detection.

[L CC synth-2D]
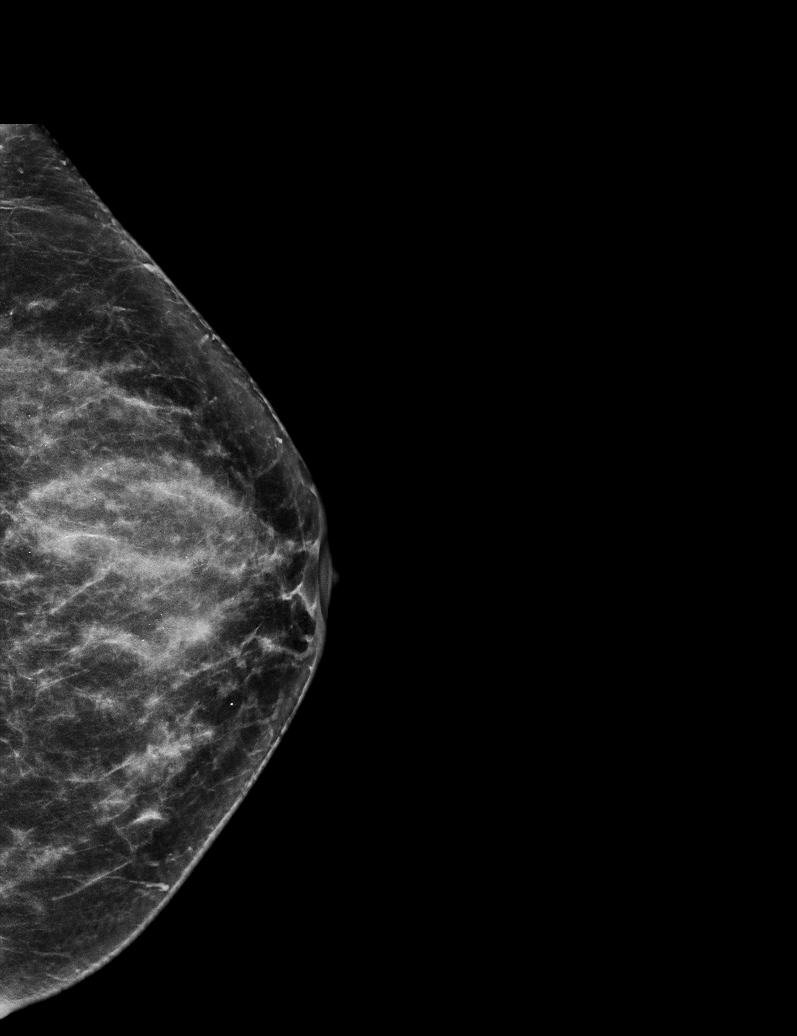

[L MLO synth-2D]
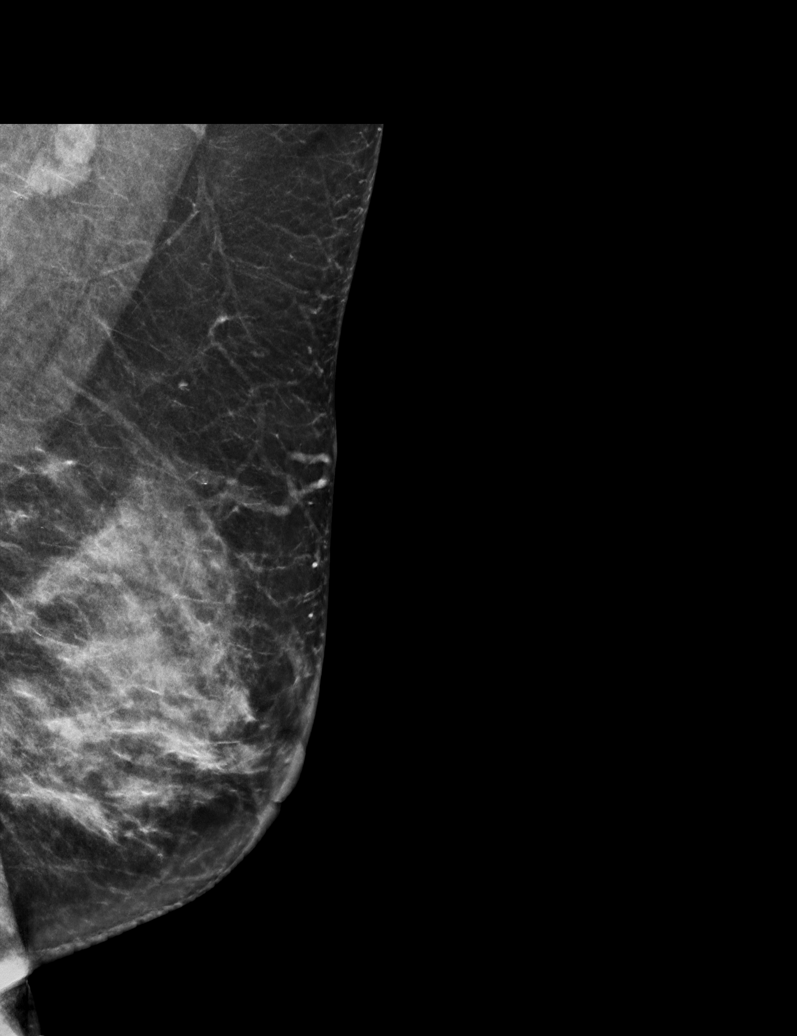

[R MLO synth-2D (1 of 2)]
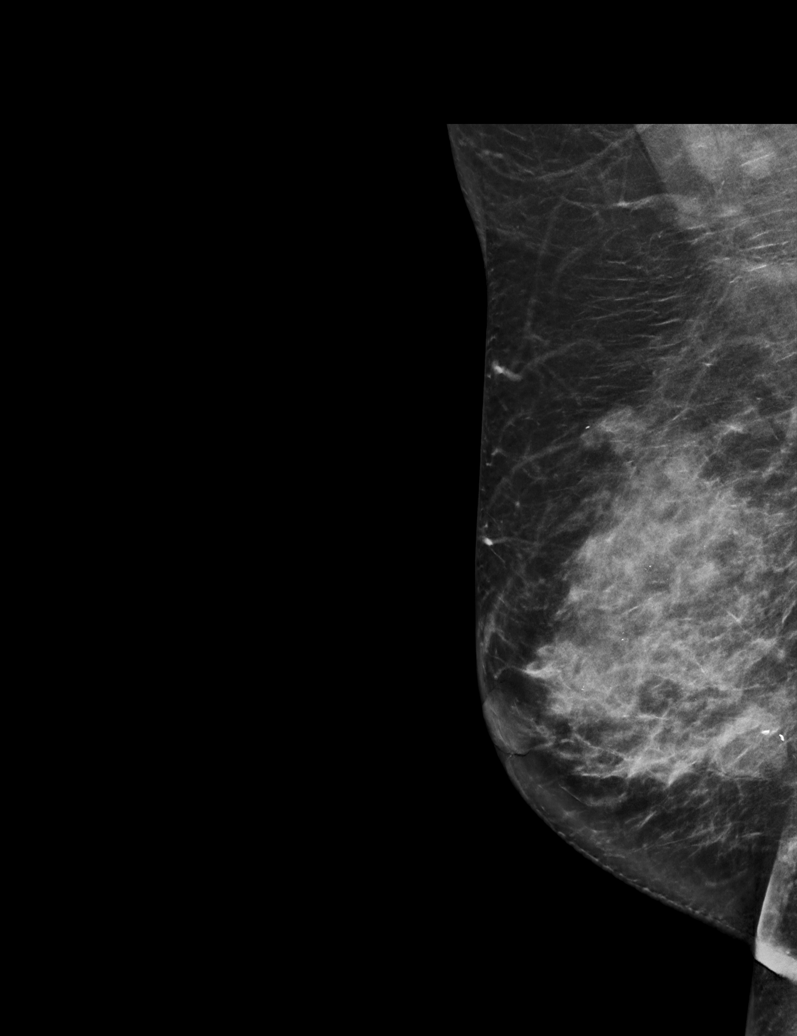

[R CC synth-2D (1 of 2)]
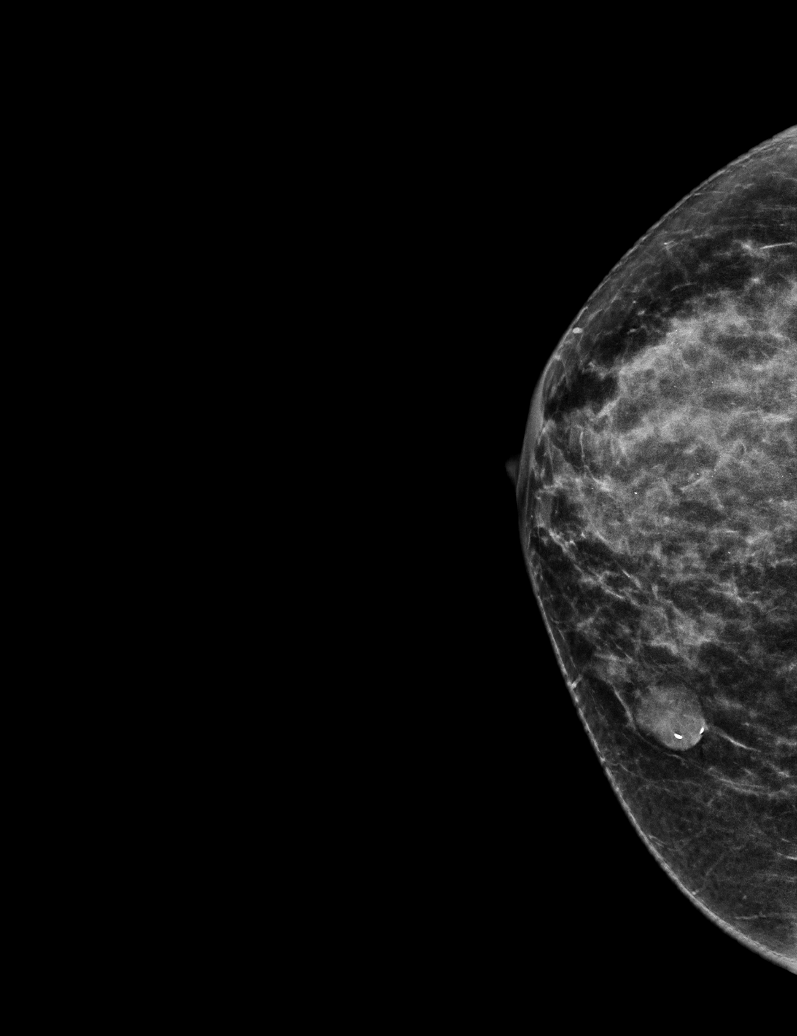

[R MLO synth-2D (2 of 2)]
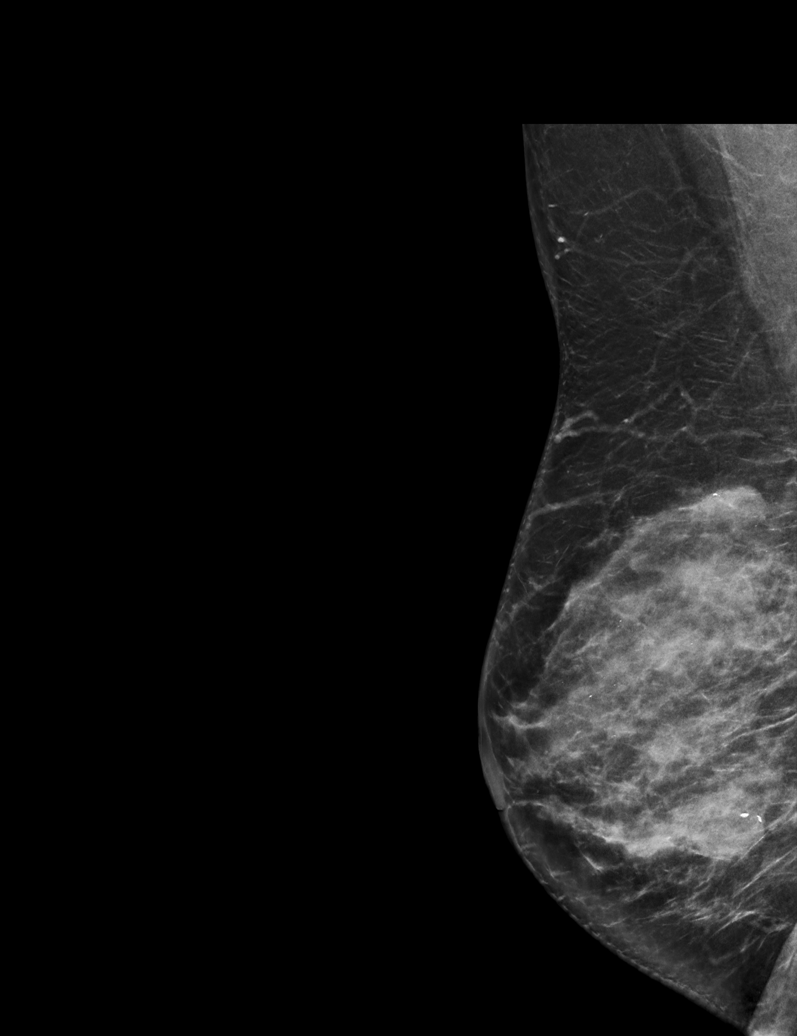

[R CC synth-2D (2 of 2)]
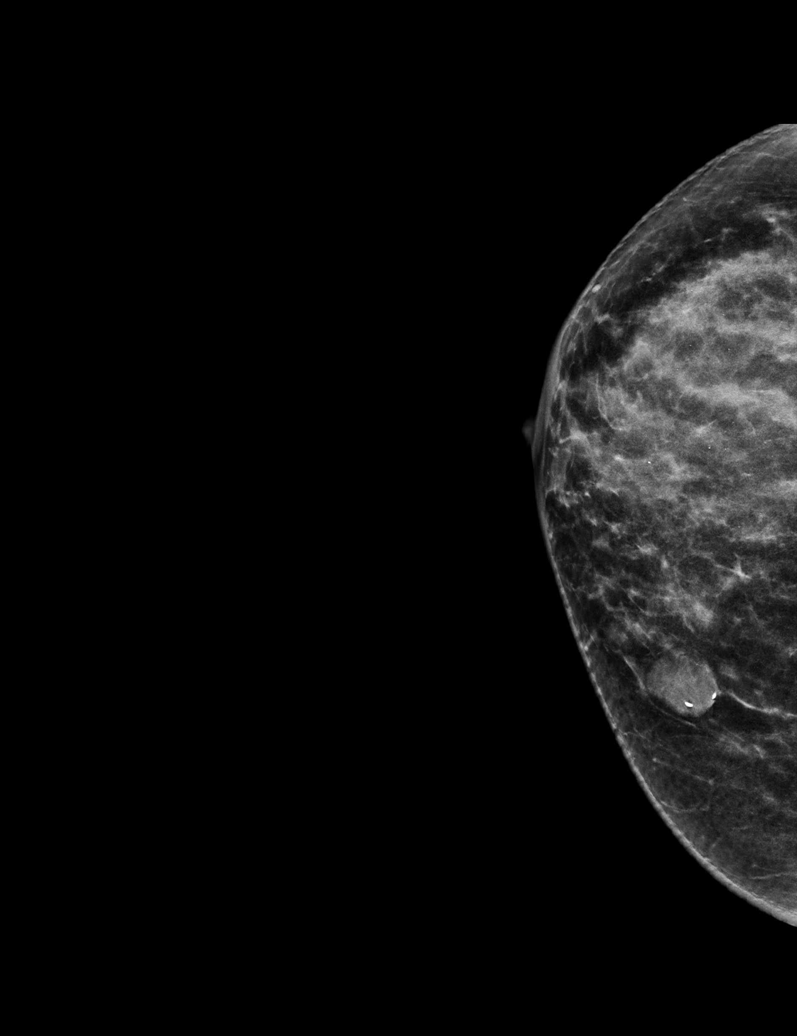

[6 of 36 positions shown; findings below may reference images not displayed]

ACR Breast Density Category c: The breast tissue is heterogeneously
dense, which may obscure small masses.
FINDINGS: There are no findings suspicious for malignancy.
IMPRESSION: No mammographic evidence of malignancy. A result letter of this
screening mammogram will be mailed directly to the patient.

RECOMMENDATION:
Screening mammogram in one year. (Code:Q3-W-BC3)

BI-RADS CATEGORY  1: Negative.

## 2023-10-06 ENCOUNTER — Other Ambulatory Visit (HOSPITAL_COMMUNITY): Payer: Self-pay

## 2023-10-17 ENCOUNTER — Other Ambulatory Visit (HOSPITAL_COMMUNITY): Payer: Self-pay

## 2024-07-29 ENCOUNTER — Other Ambulatory Visit: Payer: Self-pay | Admitting: Internal Medicine

## 2024-07-29 DIAGNOSIS — Z1231 Encounter for screening mammogram for malignant neoplasm of breast: Secondary | ICD-10-CM

## 2024-08-04 ENCOUNTER — Inpatient Hospital Stay: Admission: RE | Admit: 2024-08-04 | Source: Ambulatory Visit
